# Patient Record
Sex: Female | Born: 1960 | Race: White | Hispanic: No | Marital: Married | Smoking: Never smoker
Health system: Southern US, Community
[De-identification: ages and names within clinical notes are randomized; demographics above are authoritative.]

## PROBLEM LIST (undated history)

## (undated) DIAGNOSIS — F329 Major depressive disorder, single episode, unspecified: Secondary | ICD-10-CM

## (undated) DIAGNOSIS — F32A Depression, unspecified: Secondary | ICD-10-CM

## (undated) DIAGNOSIS — G43909 Migraine, unspecified, not intractable, without status migrainosus: Secondary | ICD-10-CM

## (undated) DIAGNOSIS — F419 Anxiety disorder, unspecified: Secondary | ICD-10-CM

## (undated) HISTORY — PX: TUBAL LIGATION: SHX77

---

## 2001-05-15 ENCOUNTER — Encounter: Payer: Self-pay | Admitting: Internal Medicine

## 2001-05-15 ENCOUNTER — Inpatient Hospital Stay (HOSPITAL_COMMUNITY): Admission: EM | Admit: 2001-05-15 | Discharge: 2001-05-16 | Payer: Self-pay | Admitting: Emergency Medicine

## 2001-05-17 ENCOUNTER — Encounter: Payer: Self-pay | Admitting: Emergency Medicine

## 2001-05-18 ENCOUNTER — Encounter: Payer: Self-pay | Admitting: General Surgery

## 2001-05-18 ENCOUNTER — Inpatient Hospital Stay (HOSPITAL_COMMUNITY): Admission: EM | Admit: 2001-05-18 | Discharge: 2001-05-19 | Payer: Self-pay | Admitting: Emergency Medicine

## 2001-05-19 ENCOUNTER — Emergency Department (HOSPITAL_COMMUNITY): Admission: EM | Admit: 2001-05-19 | Discharge: 2001-05-19 | Payer: Self-pay | Admitting: *Deleted

## 2001-05-21 ENCOUNTER — Encounter: Admission: RE | Admit: 2001-05-21 | Discharge: 2001-05-21 | Payer: Self-pay

## 2001-05-27 ENCOUNTER — Inpatient Hospital Stay (HOSPITAL_COMMUNITY): Admission: EM | Admit: 2001-05-27 | Discharge: 2001-06-01 | Payer: Self-pay | Admitting: Emergency Medicine

## 2001-05-27 ENCOUNTER — Encounter: Payer: Self-pay | Admitting: Gastroenterology

## 2001-05-27 ENCOUNTER — Encounter: Admission: RE | Admit: 2001-05-27 | Discharge: 2001-05-27 | Payer: Self-pay | Admitting: Gastroenterology

## 2001-05-27 ENCOUNTER — Encounter: Payer: Self-pay | Admitting: Emergency Medicine

## 2001-05-29 ENCOUNTER — Encounter: Payer: Self-pay | Admitting: Internal Medicine

## 2001-05-30 ENCOUNTER — Encounter: Payer: Self-pay | Admitting: Internal Medicine

## 2001-05-31 ENCOUNTER — Encounter: Payer: Self-pay | Admitting: Gastroenterology

## 2001-06-24 ENCOUNTER — Encounter: Admission: RE | Admit: 2001-06-24 | Discharge: 2001-06-24 | Payer: Self-pay | Admitting: Internal Medicine

## 2002-12-20 ENCOUNTER — Emergency Department (HOSPITAL_COMMUNITY): Admission: EM | Admit: 2002-12-20 | Discharge: 2002-12-20 | Payer: Self-pay | Admitting: Emergency Medicine

## 2003-01-08 ENCOUNTER — Emergency Department (HOSPITAL_COMMUNITY): Admission: EM | Admit: 2003-01-08 | Discharge: 2003-01-08 | Payer: Self-pay | Admitting: Emergency Medicine

## 2003-04-03 ENCOUNTER — Emergency Department (HOSPITAL_COMMUNITY): Admission: EM | Admit: 2003-04-03 | Discharge: 2003-04-03 | Payer: Self-pay | Admitting: Emergency Medicine

## 2006-01-13 ENCOUNTER — Emergency Department (HOSPITAL_COMMUNITY): Admission: EM | Admit: 2006-01-13 | Discharge: 2006-01-13 | Payer: Self-pay | Admitting: Emergency Medicine

## 2007-11-22 ENCOUNTER — Emergency Department (HOSPITAL_COMMUNITY): Admission: EM | Admit: 2007-11-22 | Discharge: 2007-11-22 | Payer: Self-pay | Admitting: Emergency Medicine

## 2008-06-09 ENCOUNTER — Emergency Department (HOSPITAL_COMMUNITY): Admission: EM | Admit: 2008-06-09 | Discharge: 2008-06-09 | Payer: Self-pay | Admitting: Emergency Medicine

## 2009-02-24 ENCOUNTER — Emergency Department (HOSPITAL_COMMUNITY): Admission: EM | Admit: 2009-02-24 | Discharge: 2009-02-25 | Payer: Self-pay | Admitting: Emergency Medicine

## 2010-04-17 ENCOUNTER — Emergency Department (HOSPITAL_COMMUNITY)
Admission: EM | Admit: 2010-04-17 | Discharge: 2010-04-17 | Disposition: A | Discharge status: Home / Self Care | Payer: Self-pay | Attending: Emergency Medicine | Admitting: Emergency Medicine

## 2010-04-17 DIAGNOSIS — K130 Diseases of lips: Secondary | ICD-10-CM | POA: Insufficient documentation

## 2010-04-17 DIAGNOSIS — H6690 Otitis media, unspecified, unspecified ear: Secondary | ICD-10-CM | POA: Insufficient documentation

## 2010-04-17 DIAGNOSIS — W010XXA Fall on same level from slipping, tripping and stumbling without subsequent striking against object, initial encounter: Secondary | ICD-10-CM | POA: Insufficient documentation

## 2010-04-17 DIAGNOSIS — M545 Low back pain, unspecified: Secondary | ICD-10-CM | POA: Insufficient documentation

## 2010-04-17 DIAGNOSIS — Y929 Unspecified place or not applicable: Secondary | ICD-10-CM | POA: Insufficient documentation

## 2010-04-17 DIAGNOSIS — Y93E1 Activity, personal bathing and showering: Secondary | ICD-10-CM | POA: Insufficient documentation

## 2010-04-17 DIAGNOSIS — M2550 Pain in unspecified joint: Secondary | ICD-10-CM | POA: Insufficient documentation

## 2010-04-17 DIAGNOSIS — IMO0001 Reserved for inherently not codable concepts without codable children: Secondary | ICD-10-CM | POA: Insufficient documentation
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
## 2010-07-20 ENCOUNTER — Emergency Department (HOSPITAL_COMMUNITY)
Admission: EM | Admit: 2010-07-20 | Discharge: 2010-07-21 | Disposition: A | Discharge status: Home / Self Care | Payer: Medicaid Other | Attending: Emergency Medicine | Admitting: Emergency Medicine

## 2010-07-20 DIAGNOSIS — R110 Nausea: Secondary | ICD-10-CM | POA: Insufficient documentation

## 2010-07-20 DIAGNOSIS — G43909 Migraine, unspecified, not intractable, without status migrainosus: Secondary | ICD-10-CM | POA: Insufficient documentation

## 2010-07-20 DIAGNOSIS — H53149 Visual discomfort, unspecified: Secondary | ICD-10-CM | POA: Insufficient documentation

## 2010-11-02 ENCOUNTER — Emergency Department (HOSPITAL_COMMUNITY)
Admission: EM | Admit: 2010-11-02 | Discharge: 2010-11-02 | Disposition: A | Discharge status: Home / Self Care | Payer: Medicaid Other | Attending: Emergency Medicine | Admitting: Emergency Medicine

## 2010-11-02 DIAGNOSIS — R51 Headache: Secondary | ICD-10-CM | POA: Insufficient documentation

## 2010-11-02 DIAGNOSIS — R110 Nausea: Secondary | ICD-10-CM | POA: Insufficient documentation

## 2010-11-02 DIAGNOSIS — H53149 Visual discomfort, unspecified: Secondary | ICD-10-CM | POA: Insufficient documentation

## 2011-01-13 ENCOUNTER — Encounter: Payer: Self-pay | Admitting: Emergency Medicine

## 2011-01-13 ENCOUNTER — Emergency Department (HOSPITAL_COMMUNITY)
Admission: EM | Admit: 2011-01-13 | Discharge: 2011-01-14 | Disposition: A | Discharge status: Home / Self Care | Payer: Medicaid Other | Attending: Emergency Medicine | Admitting: Emergency Medicine

## 2011-01-13 DIAGNOSIS — Z79899 Other long term (current) drug therapy: Secondary | ICD-10-CM | POA: Insufficient documentation

## 2011-01-13 DIAGNOSIS — G43909 Migraine, unspecified, not intractable, without status migrainosus: Secondary | ICD-10-CM | POA: Insufficient documentation

## 2011-01-13 DIAGNOSIS — F329 Major depressive disorder, single episode, unspecified: Secondary | ICD-10-CM

## 2011-01-13 DIAGNOSIS — H53149 Visual discomfort, unspecified: Secondary | ICD-10-CM | POA: Insufficient documentation

## 2011-01-13 DIAGNOSIS — F3289 Other specified depressive episodes: Secondary | ICD-10-CM | POA: Insufficient documentation

## 2011-01-13 DIAGNOSIS — R45850 Homicidal ideations: Secondary | ICD-10-CM

## 2011-01-13 HISTORY — DX: Migraine, unspecified, not intractable, without status migrainosus: G43.909

## 2011-01-13 HISTORY — DX: Major depressive disorder, single episode, unspecified: F32.9

## 2011-01-13 HISTORY — DX: Anxiety disorder, unspecified: F41.9

## 2011-01-13 HISTORY — DX: Depression, unspecified: F32.A

## 2011-01-13 LAB — RAPID URINE DRUG SCREEN, HOSP PERFORMED
Amphetamines: NOT DETECTED
Benzodiazepines: NOT DETECTED
Opiates: NOT DETECTED

## 2011-01-13 LAB — BASIC METABOLIC PANEL
CO2: 25 mEq/L (ref 19–32)
Chloride: 107 mEq/L (ref 96–112)
Creatinine, Ser: 0.76 mg/dL (ref 0.50–1.10)
GFR calc Af Amer: >90 mL/min (ref >90)
Potassium: 3.1 mEq/L — ABNORMAL LOW (ref 3.5–5.1)
Sodium: 144 mEq/L (ref 135–145)

## 2011-01-13 LAB — DIFFERENTIAL
Basophils Absolute: 0 10*3/uL (ref 0.0–0.1)
Basophils Relative: 0 % (ref 0–1)
Lymphocytes Relative: 30 % (ref 12–46)
Monocytes Absolute: 0.5 10*3/uL (ref 0.1–1.0)
Neutro Abs: 4.5 10*3/uL (ref 1.7–7.7)
Neutrophils Relative %: 62 % (ref 43–77)

## 2011-01-13 LAB — CBC
HCT: 44.3 % (ref 36.0–46.0)
MCHC: 34.5 g/dL (ref 30.0–36.0)
Platelets: 306 10*3/uL (ref 150–400)
RDW: 13.9 % (ref 11.5–15.5)
WBC: 7.3 10*3/uL (ref 4.0–10.5)

## 2011-01-13 LAB — URINALYSIS, ROUTINE W REFLEX MICROSCOPIC
Glucose, UA: NEGATIVE mg/dL
Ketones, ur: NEGATIVE mg/dL
Leukocytes, UA: NEGATIVE
Nitrite: NEGATIVE
Specific Gravity, Urine: 1.002 — ABNORMAL LOW (ref 1.005–1.030)
pH: 7 (ref 5.0–8.0)

## 2011-01-13 LAB — ETHANOL: Alcohol, Ethyl (B): <11 mg/dL (ref 0–11)

## 2011-01-13 MED ORDER — ONDANSETRON HCL 8 MG PO TABS: 4.0000 mg | ORAL_TABLET | Freq: Three times a day (TID) | ORAL | Status: DC | PRN

## 2011-01-13 MED ORDER — METOCLOPRAMIDE HCL 5 MG/ML IJ SOLN
10.0000 mg | Freq: Once | INTRAMUSCULAR | Status: DC
  Filled 2011-01-13 (×2): qty 2

## 2011-01-13 MED ORDER — ACYCLOVIR 400 MG PO TABS
400.0000 mg | ORAL_TABLET | Freq: Two times a day (BID) | ORAL | Status: DC
  Filled 2011-01-13: qty 1

## 2011-01-13 MED ORDER — DEXAMETHASONE SODIUM PHOSPHATE 10 MG/ML IJ SOLN
10.0000 mg | Freq: Once | INTRAMUSCULAR | Status: AC
  Administered 2011-01-13: 10 mg via INTRAVENOUS
  Filled 2011-01-13: qty 1

## 2011-01-13 MED ORDER — IBUPROFEN 200 MG PO TABS
600.0000 mg | ORAL_TABLET | Freq: Three times a day (TID) | ORAL | Status: DC | PRN
  Administered 2011-01-14: 600 mg via ORAL
  Filled 2011-01-13: qty 3

## 2011-01-13 MED ORDER — MEDROXYPROGESTERONE ACETATE 2.5 MG PO TABS
2.5000 mg | ORAL_TABLET | Freq: Every day | ORAL | Status: DC
  Administered 2011-01-14 (×2): 2.5 mg via ORAL
  Filled 2011-01-13 (×3): qty 1

## 2011-01-13 MED ORDER — HYDROMORPHONE HCL PF 1 MG/ML IJ SOLN
1.0000 mg | Freq: Once | INTRAMUSCULAR | Status: AC
  Administered 2011-01-13: 1 mg via INTRAVENOUS
  Filled 2011-01-13 (×2): qty 1

## 2011-01-13 MED ORDER — ACETAMINOPHEN 325 MG PO TABS
650.0000 mg | ORAL_TABLET | ORAL | Status: DC | PRN
  Administered 2011-01-14: 650 mg via ORAL
  Filled 2011-01-13: qty 2

## 2011-01-13 MED ORDER — ACYCLOVIR 200 MG PO CAPS
400.0000 mg | ORAL_CAPSULE | Freq: Two times a day (BID) | ORAL | Status: DC
  Administered 2011-01-14: 400 mg via ORAL
  Filled 2011-01-13 (×2): qty 2

## 2011-01-13 MED ORDER — ALPRAZOLAM 0.5 MG PO TABS
0.5000 mg | ORAL_TABLET | Freq: Every evening | ORAL | Status: DC | PRN
  Administered 2011-01-13: 0.5 mg via ORAL
  Filled 2011-01-13 (×2): qty 1

## 2011-01-13 MED ORDER — NICOTINE 21 MG/24HR TD PT24
21.0000 mg | MEDICATED_PATCH | Freq: Every day | TRANSDERMAL | Status: DC
  Filled 2011-01-13: qty 1

## 2011-01-13 MED ORDER — DIPHENHYDRAMINE HCL 50 MG/ML IJ SOLN
25.0000 mg | Freq: Once | INTRAMUSCULAR | Status: AC
  Administered 2011-01-13: 25 mg via INTRAVENOUS
  Filled 2011-01-13: qty 1

## 2011-01-13 MED ORDER — LORAZEPAM 1 MG PO TABS
1.0000 mg | ORAL_TABLET | Freq: Three times a day (TID) | ORAL | Status: DC | PRN
  Administered 2011-01-14: 1 mg via ORAL
  Filled 2011-01-13: qty 1

## 2011-01-13 MED ORDER — HYDROMORPHONE HCL PF 1 MG/ML IJ SOLN
1.0000 mg | Freq: Once | INTRAMUSCULAR | Status: AC
  Administered 2011-01-13: 1 mg via INTRAVENOUS
  Filled 2011-01-13: qty 1

## 2011-01-13 MED ORDER — ZOLPIDEM TARTRATE 5 MG PO TABS
5.0000 mg | ORAL_TABLET | Freq: Every evening | ORAL | Status: DC | PRN
  Administered 2011-01-13: 5 mg via ORAL
  Filled 2011-01-13 (×3): qty 1
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
## 2011-01-13 NOTE — ED Notes (Signed)
Per RN Nilda Calamity, pt reports that she was upstairs visiting a pt. Pt asked same nurse when would she be seen. Nurse told pt where she was in line and pt went to bathroom. Pt was found on bathroom floor and assisted to a seated position.

## 2011-01-13 NOTE — ED Notes (Signed)
Pt reports recent stress in her life, now with migraine x 3 days.
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
## 2011-01-14 MED ORDER — METOCLOPRAMIDE HCL 5 MG/ML IJ SOLN
10.0000 mg | Freq: Once | INTRAMUSCULAR | Status: AC
  Administered 2011-01-14: 10 mg via INTRAVENOUS

## 2011-01-14 MED ORDER — HYDROMORPHONE HCL PF 2 MG/ML IJ SOLN
2.0000 mg | Freq: Once | INTRAMUSCULAR | Status: AC
  Administered 2011-01-14: 2 mg via INTRAMUSCULAR
  Filled 2011-01-14: qty 1

## 2011-01-14 MED ORDER — ONDANSETRON HCL 4 MG/2ML IJ SOLN
4.0000 mg | Freq: Once | INTRAMUSCULAR | Status: AC
  Administered 2011-01-14: 4 mg via INTRAMUSCULAR
  Filled 2011-01-14: qty 2

## 2011-01-14 MED ORDER — DEXAMETHASONE SODIUM PHOSPHATE 10 MG/ML IJ SOLN
10.0000 mg | Freq: Once | INTRAMUSCULAR | Status: AC
  Administered 2011-01-14: 10 mg via INTRAVENOUS
  Filled 2011-01-14: qty 1

## 2011-01-14 MED ORDER — DIPHENHYDRAMINE HCL 50 MG/ML IJ SOLN
25.0000 mg | Freq: Once | INTRAMUSCULAR | Status: AC
  Administered 2011-01-14: 25 mg via INTRAVENOUS
  Filled 2011-01-14: qty 1

## 2011-01-14 MED ORDER — OXYCODONE-ACETAMINOPHEN 5-325 MG PO TABS: 2.0000 | ORAL_TABLET | ORAL | Status: AC | PRN

## 2011-01-14 NOTE — ED Notes (Signed)
Pt watching TV, NAD, respirations equal and nonlabored .  Pt continues to rate headache at 7/10, no N/V.  Pt has requested additional pain medication.  No current orders at this time. Will continue to monitor. VSS, A/O x3, denies CP/SOB, PEARL

## 2011-01-14 NOTE — ED Notes (Signed)
Pt finished tele-psych. Pt exhibiting anger, aggression regarding family dynamics. Pt irritable, agitated. Still c/o headache despite medication. Psychiatrists calling Dr. Ignacia Palma with recommendation.

## 2011-01-14 NOTE — ED Provider Notes (Signed)
Asked to see pt because she has a bad migraine headache.  She is unable to characterize her headache, other than to say that she has had headache intermittently for many years, and has received a number of different medications, including morphine and Dilaudid, over the years.  Exam today shows her to be awake and alert.  HEENT negative.  Neck supple.  Neur physiological.  Will order migraine cocktail of Reglan, dexamethasone, and Benadryl.  Also will order telepsych consult, as pt has not been accepted for admission for depression as yet.  Carleene Cooper III, MD 01/14/11 [phone]

## 2011-01-14 NOTE — ED Notes (Signed)
Medicated per order.  Pt VSS, pain rated at 7/10.  She reports that she has had no relief with Ibuprofen and requests "something else".

## 2011-01-14 NOTE — ED Notes (Signed)
Pt c/o headache despite medication. Dr. Ignacia Palma notified. EDP getting in touch with Behavioral health to determine if pt discharged home or admitted.

## 2011-01-14 NOTE — ED Notes (Signed)
Pt again requesting  IV pain medication.  Nurse practitioner notified of pt request.  New order received.

## 2011-01-14 NOTE — ED Notes (Signed)
Discharged home with husband. ACT team and Dr. Ignacia Palma spoke with patient, given outpatient referral. Pt deemed safe to be discharged home by Dr. Ignacia Palma, Tele-psych psychiatrist, ACT team. Pt calm on discharge. Alert and oriented.

## 2011-01-14 NOTE — ED Notes (Signed)
Pt notified, EDP working on patient case. Calling Behavioral Health

## 2011-01-14 NOTE — ED Notes (Signed)
Patient is currently talking with psychiatrist via tele psych.  Maralyn Sago, RN at bedside.

## 2011-01-14 NOTE — ED Notes (Signed)
Pt medicated. Informed of discharge home. Pt husband at bedside. Pt calm, acting appriopriatey.
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
## 2011-01-14 NOTE — ED Notes (Signed)
Pt c/o headache 9/10. Pt medicated with tylenol per Admission Orders.

## 2011-01-14 NOTE — ED Notes (Addendum)
Pt c/o headache. Pt appearing restless, becoming agitated. Pt given 1 mg Ativan per admission orders
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
## 2011-01-14 NOTE — ED Notes (Signed)
Pt in scrubs and wanded by security. Belongings inventoried

## 2011-01-14 NOTE — Progress Notes (Signed)
Pt had telepsych consult and [facility] evaluation, and the decision was that she is safe to go home.  I advised pt's husband that it would be best is his son lived somewhere else.  The husband says his son cannot afford to live elsewhere, and there is a restraining order against the son already.  Released.  Rx Percocet if needed for her headaches, as migraine meds were ineffective.

## 2011-01-14 NOTE — ED Notes (Signed)
Pt c/o migraine headache rated at 8/10.  She reports that the last pain medication given "did help some". She denies N/V at this time and reports that she has recently been under a great deal of stress.  After speaking with the nurse practitioner, a new order for pain medication was given.

## 2011-01-14 NOTE — ED Notes (Signed)
Pt requesting to have IV medications given IM, Pt IV was bent and had to be removed. Second attempt to start IV, unsuccessfully. Medications given IM in hips

## 2011-01-14 NOTE — ED Notes (Signed)
Counselor with [facility] at bedside for evaluation of patient to determine disposition.  Pt admits to homicidal ideation and was informed by the representative from [facility] that if she did not voluntarily commit to treatment, a mental hiegine order would be filed against her that would result in involuntary admission.  Pt expresses verbal understanding.

## 2011-01-14 NOTE — ED Notes (Signed)
Pt appears somewhat anxious and restless. Awaiting  Possible admissionevaluation from [facility]

## 2011-01-14 NOTE — ED Notes (Signed)
Pt report given and care endorsed to Zara Council, RN

## 2011-04-06 ENCOUNTER — Observation Stay: Payer: Self-pay | Admitting: Specialist

## 2011-04-06 LAB — BASIC METABOLIC PANEL
BUN: 8 mg/dL (ref 7–18)
Creatinine: 0.73 mg/dL (ref 0.60–1.30)
EGFR (Non-African Amer.): >60
Glucose: 92 mg/dL (ref 65–99)
Osmolality: 289 (ref 275–301)
Potassium: 3.2 mmol/L — ABNORMAL LOW (ref 3.5–5.1)
Sodium: 146 mmol/L — ABNORMAL HIGH (ref 136–145)

## 2011-04-06 LAB — CK TOTAL AND CKMB (NOT AT ARMC)
CK, Total: 145 U/L (ref 21–215)
CK, Total: 104 U/L (ref 21–215)
CK-MB: 2.7 ng/mL (ref 0.5–3.6)
CK-MB: 2.2 ng/mL (ref 0.5–3.6)

## 2011-04-06 LAB — CBC
MCV: 89 fL (ref 80–100)
Platelet: 271 10*3/uL (ref 150–440)
RBC: 4.38 10*6/uL (ref 3.80–5.20)
RDW: 14.8 % — ABNORMAL HIGH (ref 11.5–14.5)
WBC: 6.9 10*3/uL (ref 3.6–11.0)

## 2011-12-02 ENCOUNTER — Emergency Department: Payer: Self-pay | Admitting: Emergency Medicine

## 2012-01-10 ENCOUNTER — Encounter (HOSPITAL_COMMUNITY): Payer: Self-pay

## 2012-01-10 ENCOUNTER — Inpatient Hospital Stay (HOSPITAL_COMMUNITY)
Admission: AD | Admit: 2012-01-10 | Discharge: 2012-01-14 | DRG: 885 | Disposition: A | Discharge status: Home / Self Care | Payer: Medicaid Other | Source: Ambulatory Visit | Attending: Psychiatry | Admitting: Psychiatry

## 2012-01-10 DIAGNOSIS — IMO0002 Reserved for concepts with insufficient information to code with codable children: Secondary | ICD-10-CM

## 2012-01-10 DIAGNOSIS — F3160 Bipolar disorder, current episode mixed, unspecified: Principal | ICD-10-CM | POA: Diagnosis present

## 2012-01-10 DIAGNOSIS — Z79899 Other long term (current) drug therapy: Secondary | ICD-10-CM

## 2012-01-10 DIAGNOSIS — N3281 Overactive bladder: Secondary | ICD-10-CM

## 2012-01-10 DIAGNOSIS — F4310 Post-traumatic stress disorder, unspecified: Secondary | ICD-10-CM | POA: Diagnosis present

## 2012-01-10 DIAGNOSIS — R059 Cough, unspecified: Secondary | ICD-10-CM | POA: Diagnosis present

## 2012-01-10 DIAGNOSIS — R05 Cough: Secondary | ICD-10-CM | POA: Diagnosis present

## 2012-01-10 DIAGNOSIS — G43909 Migraine, unspecified, not intractable, without status migrainosus: Secondary | ICD-10-CM | POA: Diagnosis present

## 2012-01-10 MED ORDER — ACETAMINOPHEN 325 MG PO TABS
650.0000 mg | ORAL_TABLET | Freq: Four times a day (QID) | ORAL | Status: DC | PRN
  Administered 2012-01-10: 650 mg via ORAL

## 2012-01-10 MED ORDER — ALUM & MAG HYDROXIDE-SIMETH 200-200-20 MG/5ML PO SUSP: 30.0000 mL | ORAL | Status: DC | PRN

## 2012-01-10 MED ORDER — HYDROXYZINE HCL 25 MG PO TABS
25.0000 mg | ORAL_TABLET | Freq: Four times a day (QID) | ORAL | Status: DC | PRN
  Administered 2012-01-10 (×2): 25 mg via ORAL

## 2012-01-10 MED ORDER — TRAZODONE HCL 100 MG PO TABS
100.0000 mg | ORAL_TABLET | Freq: Every day | ORAL | Status: DC
Start: 2012-01-10 — End: 2012-01-10
  Administered 2012-01-10: 100 mg via ORAL
  Filled 2012-01-10 (×3): qty 1

## 2012-01-10 MED ORDER — MAGNESIUM HYDROXIDE 400 MG/5ML PO SUSP: 30.0000 mL | Freq: Every day | ORAL | Status: DC | PRN

## 2012-01-10 MED ORDER — TRAZODONE HCL 100 MG PO TABS
100.0000 mg | ORAL_TABLET | Freq: Every evening | ORAL | Status: DC | PRN
  Administered 2012-01-10 – 2012-01-13 (×6): 100 mg via ORAL
  Filled 2012-01-10 (×5): qty 1
  Filled 2012-01-10: qty 6
  Filled 2012-01-10 (×5): qty 1
  Filled 2012-01-10: qty 6
  Filled 2012-01-10: qty 1
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
## 2012-01-11 DIAGNOSIS — F3160 Bipolar disorder, current episode mixed, unspecified: Secondary | ICD-10-CM | POA: Diagnosis present

## 2012-01-11 DIAGNOSIS — F4310 Post-traumatic stress disorder, unspecified: Secondary | ICD-10-CM | POA: Diagnosis present

## 2012-01-11 LAB — URINALYSIS, ROUTINE W REFLEX MICROSCOPIC
Glucose, UA: NEGATIVE mg/dL
Ketones, ur: NEGATIVE mg/dL
Leukocytes, UA: NEGATIVE
Nitrite: NEGATIVE
Protein, ur: NEGATIVE mg/dL
Urobilinogen, UA: 0.2 mg/dL (ref 0.0–1.0)

## 2012-01-11 LAB — RAPID URINE DRUG SCREEN, HOSP PERFORMED
Amphetamines: NOT DETECTED
Benzodiazepines: NOT DETECTED
Opiates: NOT DETECTED

## 2012-01-11 LAB — PREGNANCY, URINE: Preg Test, Ur: NEGATIVE

## 2012-01-11 MED ORDER — RISPERIDONE 1 MG PO TABS
1.0000 mg | ORAL_TABLET | Freq: Every day | ORAL | Status: DC
  Administered 2012-01-11 – 2012-01-13 (×3): 1 mg via ORAL
  Filled 2012-01-11 (×3): qty 1
  Filled 2012-01-11: qty 3
  Filled 2012-01-11 (×2): qty 1

## 2012-01-11 MED ORDER — GABAPENTIN 100 MG PO CAPS
200.0000 mg | ORAL_CAPSULE | Freq: Three times a day (TID) | ORAL | Status: DC
  Administered 2012-01-11 – 2012-01-14 (×10): 200 mg via ORAL
  Filled 2012-01-11 (×7): qty 2
  Filled 2012-01-11: qty 18
  Filled 2012-01-11 (×8): qty 2
  Filled 2012-01-11: qty 18
  Filled 2012-01-11: qty 2
  Filled 2012-01-11: qty 18

## 2012-01-11 MED ORDER — IBUPROFEN 600 MG PO TABS
600.0000 mg | ORAL_TABLET | ORAL | Status: DC | PRN
  Administered 2012-01-11 – 2012-01-12 (×3): 600 mg via ORAL
  Filled 2012-01-11 (×3): qty 1

## 2012-01-11 MED ORDER — DIVALPROEX SODIUM ER 500 MG PO TB24
500.0000 mg | ORAL_TABLET | Freq: Two times a day (BID) | ORAL | Status: DC
  Administered 2012-01-11 – 2012-01-14 (×7): 500 mg via ORAL
  Filled 2012-01-11 (×4): qty 1
  Filled 2012-01-11: qty 6
  Filled 2012-01-11 (×2): qty 1
  Filled 2012-01-11: qty 6
  Filled 2012-01-11 (×5): qty 1
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
## 2012-01-11 NOTE — Progress Notes (Signed)
D: Pt complains of insomnia, sadness, and hopelessness due to family issues.  Interaction is direct but with avoidant eye contact and anxious expression.  Affect is anxious; mood anxious, depressed, sad.  Cooperative and compliant with unit guidelines and expectations, although did not attend evening shift groups "I'll just go and sit in my little hole".  Denies SI/HI, AVH.  Contracting for safety.  Reported headache rated at 6/10 at 2045 but refused intervention.  No evidence of disorganized thought process or content.  Pt is very angry that she is unable to sleep, explaining that it exacerbates her sense of hopelessness and apathy.  A: Pt given support in 1:1 regarding family issues; discussed coping skills.  Encouraged Pt to take advantage of peers and groups on milieu.  PRNs: Repeat Trazodone 100mg  PO @2345 , Vistaril 25mg  PO @2304  with no effect from either medication.  All medications administered according to med orders and initial POC.  Pt placed on Q15 minute safety checks as per unit protocol.  R: Pt very depressed and hopeless, made worse by inability to sleep.  Expresses deep frustration with medical care.  Safety maintained. Dion Saucier RN
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
## 2012-01-11 NOTE — Progress Notes (Signed)
[facility] Group Notes:  (Counselor/Nursing/MHT/Case Management/Adjunct)  01/11/2012 11:11 PM  Type of Therapy:  Psychoeducational Skills  Participation Level:  Minimal  Participation Quality:  Attentive  Affect:  Depressed  Cognitive:  Appropriate  Insight:  Limited  Engagement in Group:  Limited  Engagement in Therapy:  Limited  Modes of Intervention:  Education  Summary of Progress/Problems: The patient described her day as being "okay". She states that she does not have any homicidal thoughts. In addition, she mentioned that she was surprised when her husband and daughter came in to visit her. She doesn't feel that her family is supportive of her and that they are trying to find out where she is .   Her goal for tomorrow is to work on her "negativity" and "hate".    Colleen Delacruz 01/11/2012, 11:11 PM

## 2012-01-11 NOTE — Progress Notes (Signed)
D: Patient appropriate and cooperative with staff and peers. Mood/affect anxious. She reported on self inventory sheet that her energy level is low, but ability to pay attention is good. Patient rated depression and feelings of hopelessness "7".  A: Support and encouragement provided to patient. Monitor 15 minute checks for safety.  R: Patient receptive. Denies SI/HI/AVH. Patient remains safe.
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
## 2012-01-12 LAB — CBC WITH DIFFERENTIAL/PLATELET
Basophils Absolute: 0 10*3/uL (ref 0.0–0.1)
Eosinophils Absolute: 0.4 10*3/uL (ref 0.0–0.7)
Eosinophils Relative: 6 % — ABNORMAL HIGH (ref 0–5)
MCH: 29.4 pg (ref 26.0–34.0)
MCHC: 34.2 g/dL (ref 30.0–36.0)
MCV: 85.9 fL (ref 78.0–100.0)
Platelets: 240 10*3/uL (ref 150–400)
RDW: 13.5 % (ref 11.5–15.5)

## 2012-01-12 LAB — COMPREHENSIVE METABOLIC PANEL
ALT: 23 U/L (ref 0–35)
AST: 26 U/L (ref 0–37)
Calcium: 9.3 mg/dL (ref 8.4–10.5)
GFR calc Af Amer: >90 mL/min (ref >90)
Glucose, Bld: 106 mg/dL — ABNORMAL HIGH (ref 70–99)
Sodium: 140 mEq/L (ref 135–145)
Total Protein: 7 g/dL (ref 6.0–8.3)

## 2012-01-12 LAB — T3, FREE: T3, Free: 3.6 pg/mL (ref 2.3–4.2)

## 2012-01-12 LAB — T4, FREE: Free T4: 1.25 ng/dL (ref 0.80–1.80)

## 2012-01-12 NOTE — Progress Notes (Signed)
[facility] Group Notes:  (Counselor/Nursing/MHT/Case Management/Adjunct)  01/12/2012 10:28 PM  Type of Therapy:  Psychoeducational Skills  Participation Level:  Active  Participation Quality:  Attentive  Affect:  Blunted  Cognitive:  Appropriate  Insight:  Good  Engagement in Group:  Good  Engagement in Therapy:  Good  Modes of Intervention:  Education  Summary of Progress/Problems:The patient mentioned in group this evening that she had a good day today. She attributed her positive results today to spending more time in the dayroom and to having a good visit with her family. Her goal for tomorrow is to "beat my anger".    Colleen Delacruz S 01/12/2012, 10:28 PM

## 2012-01-12 NOTE — Progress Notes (Signed)
D: Pt mood is depressed. Pt maintains fair eye contact. Pt rates her depression 1/10 and hopelessness 1/10. Pt attended group tonight. Pt interacts well within milieu.  A: Support given. Verbalization encouraged. Pt encouraged to come to nurse with any concerns. Medications given as ordered.   R: Pt is receptive, cooperative. No complaints of pain or discomfort at this time. Q15 min safety checks maintained. Will continue to monitor.

## 2012-01-12 NOTE — Progress Notes (Signed)
[facility] MD Progress Note  01/12/2012 11:37 AM Colleen Delacruz  MRN:  [id number]   Subjective: Met with the patient 1:1 to discuss her response to treatment.  She immediately asks to be discharged. States she has to go to work. She is explained that the current plan of care does not indicate that she is on the discharge list for the weekend. Diagnosis:   AXIS I: Bipolar affective disorder, mixed, PTSD  AXIS II: Deferred  AXIS III:  Past Medical History   Diagnosis  Date   .  Migraines    .  Depression    .  Anxiety     AXIS IV: other psychosocial or environmental problems  AXIS V: 11-20 some danger of hurting self or others possible OR occasionally fails to maintain minimal personal hygiene OR gross impairment in communication   ADL's:  Intact  Sleep: Fair  Appetite:  Fair  Suicidal Ideation:  denies Homicidal Ideation:  denies  AEB (as evidenced by):  Mental Status Examination/Evaluation: Objective:  Appearance: Disheveled  Eye Contact::  Fair  Speech:  Pressured  Volume:  Increased  Mood:  Anxious and Depressed  Affect:  Congruent  Thought Process:  Circumstantial  Orientation:  Full  Thought Content:  WDL  Suicidal Thoughts:  No  Homicidal Thoughts:  No  Memory:  Immediate;   Fair  Judgement:  Impaired  Insight:  Lacking  Psychomotor Activity:  Normal  Concentration:  Fair  Recall:  Fair  Akathisia:  No  Handed:  Right  AIMS (if indicated):     Assets:  Desire for Improvement Housing  Sleep:  Number of Hours: 6.5    Vital Signs:Blood pressure 122/78, pulse 80, temperature 97.4 F (36.3 C), temperature source Oral, resp. rate 16. Current Medications: Current Facility-Administered Medications  Medication Dose Route Frequency Provider Last Rate Last Dose  . acetaminophen (TYLENOL) tablet 650 mg  650 mg Oral Q6H PRN Nanine Means, NP   650 mg at 01/10/12 1814  . alum & mag hydroxide-simeth (MAALOX/MYLANTA) 200-200-20 MG/5ML suspension 30 mL  30 mL Oral Q4H  PRN Nanine Means, NP      . divalproex (DEPAKOTE ER) 24 hr tablet 500 mg  500 mg Oral BID Sanjuana Kava, NP   500 mg at 01/12/12 0800  . gabapentin (NEURONTIN) capsule 200 mg  200 mg Oral TID Sanjuana Kava, NP   200 mg at 01/12/12 0800  . hydrOXYzine (ATARAX/VISTARIL) tablet 25 mg  25 mg Oral Q6H PRN Nanine Means, NP   25 mg at 01/10/12 2304  . ibuprofen (ADVIL,MOTRIN) tablet 600 mg  600 mg Oral Q4H PRN Sanjuana Kava, NP   600 mg at 01/12/12 0654  . magnesium hydroxide (MILK OF MAGNESIA) suspension 30 mL  30 mL Oral Daily PRN Nanine Means, NP      . risperiDONE (RISPERDAL) tablet 1 mg  1 mg Oral QHS Sanjuana Kava, NP   1 mg at 01/11/12 2111  . traZODone (DESYREL) tablet 100 mg  100 mg Oral QHS,MR X 1 Himabindu Ravi, MD   100 mg at 01/11/12 2111    Lab Results:  Results for orders placed during the hospital encounter of 01/10/12 (from the past 48 hour(s))  URINALYSIS, ROUTINE W REFLEX MICROSCOPIC     Status: Normal   Collection Time   01/11/12 11:52 AM      Component Value Range Comment   Color, Urine YELLOW  YELLOW    APPearance CLEAR  CLEAR  Specific Gravity, Urine 1.005  1.005 - 1.030    pH 6.5  5.0 - 8.0    Glucose, UA NEGATIVE  NEGATIVE mg/dL    Hgb urine dipstick NEGATIVE  NEGATIVE    Bilirubin Urine NEGATIVE  NEGATIVE    Ketones, ur NEGATIVE  NEGATIVE mg/dL    Protein, ur NEGATIVE  NEGATIVE mg/dL    Urobilinogen, UA 0.2  0.0 - 1.0 mg/dL    Nitrite NEGATIVE  NEGATIVE    Leukocytes, UA NEGATIVE  NEGATIVE MICROSCOPIC NOT DONE ON URINES WITH NEGATIVE PROTEIN, BLOOD, LEUKOCYTES, NITRITE, OR GLUCOSE <1000 mg/dL.  PREGNANCY, URINE     Status: Normal   Collection Time   01/11/12 11:52 AM      Component Value Range Comment   Preg Test, Ur NEGATIVE  NEGATIVE   URINE RAPID DRUG SCREEN (HOSP PERFORMED)     Status: Abnormal   Collection Time   01/11/12 11:52 AM      Component Value Range Comment   Opiates NONE DETECTED  NONE DETECTED    Cocaine NONE DETECTED  NONE DETECTED     Benzodiazepines NONE DETECTED  NONE DETECTED    Amphetamines NONE DETECTED  NONE DETECTED    Tetrahydrocannabinol NONE DETECTED  NONE DETECTED    Barbiturates POSITIVE (*) NONE DETECTED   CBC WITH DIFFERENTIAL     Status: Abnormal   Collection Time   01/12/12  7:04 AM      Component Value Range Comment   WBC 6.7  4.0 - 10.5 K/uL    RBC 4.97  3.87 - 5.11 MIL/uL    Hemoglobin 14.6  12.0 - 15.0 g/dL    HCT 40.9  81.1 - 91.4 %    MCV 85.9  78.0 - 100.0 fL    MCH 29.4  26.0 - 34.0 pg    MCHC 34.2  30.0 - 36.0 g/dL    RDW 78.2  95.6 - 21.3 %    Platelets 240  150 - 400 K/uL    Neutrophils Relative 59  43 - 77 %    Neutro Abs 3.9  1.7 - 7.7 K/uL    Lymphocytes Relative 28  12 - 46 %    Lymphs Abs 1.9  0.7 - 4.0 K/uL    Monocytes Relative 6  3 - 12 %    Monocytes Absolute 0.4  0.1 - 1.0 K/uL    Eosinophils Relative 6 (*) 0 - 5 %    Eosinophils Absolute 0.4  0.0 - 0.7 K/uL    Basophils Relative 1  0 - 1 %    Basophils Absolute 0.0  0.0 - 0.1 K/uL   COMPREHENSIVE METABOLIC PANEL     Status: Abnormal   Collection Time   01/12/12  7:04 AM      Component Value Range Comment   Sodium 140  135 - 145 mEq/L    Potassium 4.2  3.5 - 5.1 mEq/L    Chloride 104  96 - 112 mEq/L    CO2 22  19 - 32 mEq/L    Glucose, Bld 106 (*) 70 - 99 mg/dL    BUN 13  6 - 23 mg/dL    Creatinine, Ser 0.86  0.50 - 1.10 mg/dL    Calcium 9.3  8.4 - 57.8 mg/dL    Total Protein 7.0  6.0 - 8.3 g/dL    Albumin 3.5  3.5 - 5.2 g/dL    AST 26  0 - 37 U/L    ALT 23  0 -  35 U/L    Alkaline Phosphatase 89  39 - 117 U/L    Total Bilirubin 0.2 (*) 0.3 - 1.2 mg/dL    GFR calc non Af Amer >90  >90 mL/min    GFR calc Af Amer >90  >90 mL/min     Physical Findings: AIMS: Facial and Oral Movements Muscles of Facial Expression: None, normal Lips and Perioral Area: None, normal Jaw: None, normal Tongue: None, normal,Extremity Movements Upper (arms, wrists, hands, fingers): None, normal Lower (legs, knees, ankles, toes):  None, normal, Trunk Movements Neck, shoulders, hips: None, normal, Overall Severity Severity of abnormal movements (highest score from questions above): None, normal Incapacitation due to abnormal movements: None, normal Patient's awareness of abnormal movements (rate only patient's report): No Awareness, Dental Status Current problems with teeth and/or dentures?: No Does patient usually wear dentures?: No  CIWA:  CIWA-Ar Total: 0  COWS:  COWS Total Score: 0   Treatment Plan Summary: Daily contact with patient to assess and evaluate symptoms and progress in treatment Medication management  Plan: 1. Will continue the current plan of care with no changes at this time. Rona Ravens. Ger Nicks PAC 01/12/2012, 11:37 AM
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
## 2012-01-12 NOTE — Progress Notes (Signed)
Psychoeducational Group Note  Date:  01/12/2012 Time:  1515  Group Topic/Focus:  Healthy Communication:   The focus of this group is to discuss communication, barriers to communication, as well as healthy ways to communicate with others.  Participation Level:  Active  Participation Quality:  Appropriate, Redirectable, Sharing and Supportive  Affect:  Appropriate and Excited  Cognitive:  Appropriate  Insight:  Good  Engagement in Group:  Good  Additional Comments:  none  Izaha Shughart M 01/12/2012, 2:06 PM

## 2012-01-12 NOTE — Progress Notes (Signed)
Group Topic/Focus:  Identifying Needs:   The focus of this group is to help patients identify their personal needs that have been historically problematic and identify healthy behaviors to address their needs.  Participation Level:  Active  Participation Quality:  Appropriate  Affect:  Appropriate  Cognitive:  Appropriate  Insight:  Good  Engagement in Group:  Good  Additional Comments:    Savannah Erbe A  

## 2012-01-13 DIAGNOSIS — N3281 Overactive bladder: Secondary | ICD-10-CM | POA: Diagnosis present

## 2012-01-13 MED ORDER — BENZONATATE 100 MG PO CAPS
100.0000 mg | ORAL_CAPSULE | Freq: Two times a day (BID) | ORAL | Status: DC
  Administered 2012-01-13 – 2012-01-14 (×2): 100 mg via ORAL
  Filled 2012-01-13 (×6): qty 1

## 2012-01-13 MED ORDER — PSEUDOEPHEDRINE HCL ER 120 MG PO TB12
120.0000 mg | ORAL_TABLET | Freq: Two times a day (BID) | ORAL | Status: DC
  Administered 2012-01-13 – 2012-01-14 (×2): 120 mg via ORAL
  Filled 2012-01-13 (×6): qty 1

## 2012-01-13 MED ORDER — SALINE SPRAY 0.65 % NA SOLN
1.0000 | NASAL | Status: DC | PRN
  Administered 2012-01-13: 1 via NASAL
  Filled 2012-01-13: qty 44

## 2012-01-13 MED ORDER — OXYBUTYNIN CHLORIDE 5 MG PO TABS
2.5000 mg | ORAL_TABLET | Freq: Two times a day (BID) | ORAL | Status: DC
  Administered 2012-01-13 – 2012-01-14 (×2): 2.5 mg via ORAL
  Filled 2012-01-13 (×6): qty 0.5
  Filled 2012-01-13 (×2): qty 3
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
## 2012-01-13 NOTE — Progress Notes (Signed)
Psychoeducational Group Note  Date:  01/13/2012 Time:  1015  Group Topic/Focus:  Making Healthy Choices:   The focus of this group is to help patients identify negative/unhealthy choices they were using prior to admission and identify positive/healthier coping strategies to replace them upon discharge.  Participation Level:  Active  Participation Quality:  Appropriate  Affect:  Appropriate  Cognitive:  Appropriate  Insight:  Good  Engagement in Group:  Good  Additional Comments:    Dione Housekeeper 01/13/2012

## 2012-01-13 NOTE — Progress Notes (Signed)
Psychoeducational Group Note  Date:  01/13/2012 Time:  1315  Group Topic/Focus:  Conflict Resolution:   The focus of this group is to discuss the conflict resolution process and how it may be used upon discharge.  Participation Level:  Active  Participation Quality:  Appropriate, Sharing and Supportive  Affect:  Appropriate  Cognitive:  Appropriate  Insight:  Good  Engagement in Group:  Good  Additional Comments:  none  Ahni Bradwell M 01/13/2012, 2:20 PM
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
## 2012-01-13 NOTE — Clinical Social Work Note (Signed)
[facility] Group Notes:  (Clinical Social Work)  01/13/2012   3:00-4:00PM  Summary of Progress/Problems:   The main focus of today's process group was for the patient to define "support" and describe what healthy supports are, then to identify the patient's current support system and decide on other supports that can be put in place to prevent future hospitalizations.  An emphasis was placed on using therapist, doctor and problem-specific support groups to expand supports. The patient expressed that her daughter (almost 106) is supportive, and maybe her husband will be.  She also has a brother she considers to be supportive, and her co-workers at her job also.  She wants to go to a psychiatrist/therapist, and will use them as supports.  She was confused about the difference between a psychiatrist and psychologist, so CSW explained this.  Type of Therapy:  Process Group  Participation Level:  Active  Participation Quality:  Attentive, Resistant and Sharing  Affect:  Blunted and Irritable  Cognitive:  Alert and Oriented  Insight:  Limited  Engagement in Group:  Good  Engagement in Therapy:  Good  Modes of Intervention:  Clarification, Education, Limit-setting, Problem-solving, Socialization, Support and Processing   Ambrose Mantle, LCSW 01/13/2012,

## 2012-01-14 LAB — VALPROIC ACID LEVEL: Valproic Acid Lvl: 55.4 ug/mL (ref 50.0–100.0)

## 2012-01-14 MED ORDER — TRAZODONE HCL 100 MG PO TABS: 100.0000 mg | ORAL_TABLET | Freq: Every evening | ORAL | Status: DC | PRN

## 2012-01-14 MED ORDER — RISPERIDONE 1 MG PO TABS: 1.0000 mg | ORAL_TABLET | Freq: Every day | ORAL | Status: DC

## 2012-01-14 MED ORDER — DIVALPROEX SODIUM ER 500 MG PO TB24: 500.0000 mg | ORAL_TABLET | Freq: Two times a day (BID) | ORAL | Status: DC

## 2012-01-14 MED ORDER — IBUPROFEN 200 MG PO TABS: 400.0000 mg | ORAL_TABLET | Freq: Four times a day (QID) | ORAL | Status: AC | PRN

## 2012-01-14 MED ORDER — GABAPENTIN 100 MG PO CAPS: 200.0000 mg | ORAL_CAPSULE | Freq: Three times a day (TID) | ORAL | Status: DC

## 2012-01-14 MED ORDER — OXYBUTYNIN CHLORIDE 5 MG PO TABS: 2.5000 mg | ORAL_TABLET | Freq: Two times a day (BID) | ORAL | Status: DC
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
## 2012-01-14 NOTE — [facility] Suicide Risk Assessment (Signed)
Suicide Risk Assessment  Discharge Assessment     Demographic Factors:  Female, Caucasian, employed, married  Mental Status Per Nursing Assessment::   On Admission:  Thoughts of violence towards others  Current Mental Status by Physician: Patient alert and oriented to 4. Denies AH/VH/SI/HI.  Loss Factors: Loss of significant relationship  Historical Factors: Family history of mental illness or substance abuse and Impulsivity  Risk Reduction Factors:   Responsible for children under 61 years of age, Employed, Positive social support and Positive coping skills or problem solving skills  Continued Clinical Symptoms:  Depression:   Recent sense of peace/wellbeing  Cognitive Features That Contribute To Risk:  Thought constriction (tunnel vision)    Suicide Risk:  Minimal: No identifiable suicidal ideation.  Patients presenting with no risk factors but with morbid ruminations; may be classified as minimal risk based on the severity of the depressive symptoms  Discharge Diagnoses:   AXIS I:  Depressive Disorder NOS and Post Traumatic Stress Disorder AXIS II:  No diagnosis AXIS III:   Past Medical History  Diagnosis Date  . Migraines   . Depression   . Anxiety    AXIS IV:  other psychosocial or environmental problems AXIS V:  61-70 mild symptoms  Plan Of Care/Follow-up recommendations:  Activity:  normal Diet:  normal Follow up with outpatient appointments.  Is patient on multiple antipsychotic therapies at discharge:  No   Has Patient had three or more failed trials of antipsychotic monotherapy by history:  No  Recommended Plan for Multiple Antipsychotic Therapies: NA  Abshir Paolini 01/14/2012, 10:17 AM

## 2012-01-14 NOTE — Tx Team (Addendum)
Interdisciplinary Treatment Plan Update (Adult)  Date:  01/14/2012  Time Reviewed:  10:00 AM   Progress in Treatment: Attending groups:   Yes   Participating in groups:  Yes Taking medication as prescribed:  Yes Tolerating medication:  Yes Family/Significant othe contact made: Contact to be made with family Patient understands diagnosis:  Yes Discussing patient identified problems/goals with staff: Yes Medical problems stabilized or resolved: Yes Denies suicidal/homicidal ideation:Yes Issues/concerns per patient self-inventory:  Other:   New problem(s) identified:  Reason for Continuation of Hospitalization:  Interventions implemented related to continuation of hospitalization:  Additional comments:  Estimated length of stay:  Discharge home today  Discharge Plan:  Home with outpatient follow up  New goal(s):  Review of initial/current patient goals per problem list:    1.  Goal(s): Eliminate SI/other thoughts of self harm   Met:  Yes  Target date: d/c  As evidenced by: Patient no longer endorsing SI/HI or other thoughts of self harm.    2.  Goal (s):Reduce depression/anxiety (rated at one today)  Met: Yes  Target date: d/c  As evidenced by: Patient will rate symptoms at four or below    3.  Goal(s):.stabilize on meds   Met:  Yes  Target date: d/c  As evidenced by: Patient reports being stabilized on medications - less symptomatic    4.  Goal(s): Refer for outpatient follow up   Met:  No  Target date: d/c  As evidenced by: Follow up appointment will be scheduled    Attendees: Patient:  Colleen Delacruz 01/14/2012 10:00 AM  Physican:  Patrick North, MD 01/14/2012 10:00 AM  Nursing:  Neill Loft, RN 01/14/2012 10:00 AM   Nursing:   Quintella Reichert, RN 01/14/2012 10:00 AM   Clinical Social Worker:  Juline Patch, LCSW 01/14/2012 10:00 AM   Other: Serena Colonel, FNP 01/14/2012 10:00 AM
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
## 2012-01-14 NOTE — Progress Notes (Signed)
[facility] Group Notes:  (Counselor/Nursing/MHT/Case Management/Adjunct)  01/14/2012 12:13 AM  Type of Therapy:  Psychoeducational Skills  Participation Level:  Active  Participation Quality:  Appropriate  Affect:  Appropriate  Cognitive:  Appropriate  Insight:  Good  Engagement in Group:  Good  Engagement in Therapy:  Good  Modes of Intervention:  Education  Summary of Progress/Problems: The patient mentioned that she had a good day overall. She attended groups and had a family visit. The patient verbalized that she met with her husband and that the visit didn't go well. Her goal for tomorrow is to get discharged.    Ardean Melroy S 01/14/2012, 12:13 AM

## 2012-01-14 NOTE — Progress Notes (Signed)
[facility] Adult Inpatient Family/Significant Other Suicide Prevention Education  Suicide Prevention Education:  Education Completed; Markus Daft, Husband, [phone] has been identified by the patient as the family member/significant other with whom the patient will be residing, and identified as the person(s) who will aid the patient in the event of a mental health crisis (suicidal ideations/suicide attempt).  With written consent from the patient, the family member/significant other has been provided the following suicide prevention education, prior to the and/or following the discharge of the patient.  The suicide prevention education provided includes the following:  Suicide risk factors  Suicide prevention and interventions  National Suicide Hotline telephone number  [facility] assessment telephone number  [facility] Emergency Assistance 911  [facility] and/or Residential Mobile Crisis Unit telephone number  Request made of family/significant other to:  Remove weapons (e.g., guns, rifles, knives), all items previously/currently identified as safety concern.  Husband advised there are no guns in the home.  Remove drugs/medications (over-the-counter, prescriptions, illicit drugs), all items previously/currently identified as a safety concern.  The family member/significant other verbalizes understanding of the suicide prevention education information provided.  The family member/significant other agrees to remove the items of safety concern listed above.  Wynn Banker 01/14/2012, 12:45 PM
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
## 2012-01-15 NOTE — Discharge Summary (Signed)
Reviewed

## 2012-01-18 NOTE — Progress Notes (Signed)
Patient Discharge Instructions:  After Visit Summary (AVS):   Faxed to:  01/18/12 Psychiatric Admission Assessment Note:   Faxed to:  01/18/12 Suicide Risk Assessment - Discharge Assessment:   Faxed to:  01/18/12 Faxed/Sent to the Next Level Care provider:  01/18/12 Faxed to [facility] @ [phone]  Jerelene Redden, 01/18/2012, 1:46 PM

## 2012-05-09 ENCOUNTER — Ambulatory Visit: Payer: Self-pay

## 2012-07-31 ENCOUNTER — Emergency Department: Payer: Self-pay | Admitting: Unknown Physician Specialty

## 2012-08-27 ENCOUNTER — Encounter (HOSPITAL_COMMUNITY): Payer: Self-pay | Admitting: Emergency Medicine

## 2012-08-27 ENCOUNTER — Emergency Department (HOSPITAL_COMMUNITY)
Admission: EM | Admit: 2012-08-27 | Discharge: 2012-08-27 | Disposition: A | Discharge status: Home / Self Care | Payer: No Typology Code available for payment source | Attending: Emergency Medicine | Admitting: Emergency Medicine

## 2012-08-27 DIAGNOSIS — F411 Generalized anxiety disorder: Secondary | ICD-10-CM | POA: Diagnosis not present

## 2012-08-27 DIAGNOSIS — M542 Cervicalgia: Secondary | ICD-10-CM | POA: Insufficient documentation

## 2012-08-27 DIAGNOSIS — Z8679 Personal history of other diseases of the circulatory system: Secondary | ICD-10-CM | POA: Insufficient documentation

## 2012-08-27 DIAGNOSIS — M545 Low back pain: Secondary | ICD-10-CM | POA: Diagnosis present

## 2012-08-27 DIAGNOSIS — M549 Dorsalgia, unspecified: Secondary | ICD-10-CM

## 2012-08-27 DIAGNOSIS — IMO0001 Reserved for inherently not codable concepts without codable children: Secondary | ICD-10-CM | POA: Diagnosis not present

## 2012-08-27 DIAGNOSIS — Y929 Unspecified place or not applicable: Secondary | ICD-10-CM | POA: Insufficient documentation

## 2012-08-27 DIAGNOSIS — Y9389 Activity, other specified: Secondary | ICD-10-CM | POA: Insufficient documentation

## 2012-08-27 DIAGNOSIS — F3289 Other specified depressive episodes: Secondary | ICD-10-CM | POA: Insufficient documentation

## 2012-08-27 DIAGNOSIS — F329 Major depressive disorder, single episode, unspecified: Secondary | ICD-10-CM | POA: Insufficient documentation

## 2012-08-27 DIAGNOSIS — Z79899 Other long term (current) drug therapy: Secondary | ICD-10-CM | POA: Insufficient documentation

## 2012-08-27 DIAGNOSIS — M62838 Other muscle spasm: Secondary | ICD-10-CM | POA: Diagnosis not present

## 2012-08-27 MED ORDER — OXYCODONE-ACETAMINOPHEN 5-325 MG PO TABS
1.0000 | ORAL_TABLET | Freq: Once | ORAL | Status: AC
  Administered 2012-08-27: 1 via ORAL
  Filled 2012-08-27: qty 1

## 2012-08-27 MED ORDER — TRAMADOL HCL 50 MG PO TABS: 50.0000 mg | ORAL_TABLET | Freq: Four times a day (QID) | ORAL | Status: DC | PRN
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
## 2012-08-27 NOTE — ED Notes (Signed)
Pt dc to home. Pt sts understanding to dc instructions. Pt ambulatory to exit without difficulty.  Pt denies need for w/c.  

## 2012-08-27 NOTE — ED Provider Notes (Signed)
Medical screening examination/treatment/procedure(s) were performed by non-physician practitioner and as supervising physician I was immediately available for consultation/collaboration.  Sunnie Nielsen, MD 08/27/12 [phone]

## 2012-08-28 ENCOUNTER — Encounter: Payer: Self-pay | Admitting: Physical Medicine & Rehabilitation

## 2012-10-02 ENCOUNTER — Ambulatory Visit: Payer: Self-pay | Admitting: Physical Medicine & Rehabilitation

## 2014-06-06 NOTE — H&P (Signed)
PATIENT NAME:  Colleen Delacruz, Colleen Delacruz MR#:  [id number] DATE OF BIRTH:  [birth year]  DATE OF ADMISSION:  04/06/2011  PRIMARY CARE PHYSICIAN: Dr. Dennison Mascotracy [place]il[place] in [place]    CHIEF COMPLAINT: Chest pain.   HISTORY OF PRESENT ILLNESS: Ms. Pecola LeisureManess is a 54 year old Caucasian female who is passing through a stress at home these days. She reported that around 1 in the morning she had some headache and then she developed midsternal chest pain radiating to the neck and left arm. The pain was described as shooting pain. It lasts for about five minutes and then comes back associated with some shortness of breath. She reports no vomiting but mild nausea. She has no prior history of chest pain.  REVIEW OF SYSTEMS: CONSTITUTIONAL: Denies any fever. No chills. No night sweats. No fatigue. EYES: No blurring of vision. No double vision. ENT: No hearing impairment. No sore throat. No dysphagia. CARDIOVASCULAR: Reports chest pain and mild shortness of breath. No edema. No syncope. RESPIRATORY: No cough. No sputum production. Reports chest pain as above. GI: Mild nausea. No vomiting. No abdominal pain. No diarrhea. GENITOURINARY: No dysuria or frequency of urination. MUSCULOSKELETAL: No joint swelling or pain. No muscular pain or swelling. INTEGUMENTARY: No skin rash. No ulcers. NEUROLOGIC: No focal weakness. No seizure activity. She had headache earlier but eased off. PSYCHIATRY: Looks depressed. She has history of anxiety. She takes Xanax p.r.n. ENDOCRINE: No night sweats. No heat or cold intolerance.   PAST MEDICAL HISTORY: History of migraine. No prior history of hypertension, diabetes, or coronary artery disease.   PAST SURGICAL HISTORY: History of tubal ligation.   SOCIAL HABITS: Nonsmoker. No history of alcohol or drug abuse.   ALLERGIES: No known drug allergies.   MEDICATIONS: 1. She takes medicine for menopause, does not recall the name. 2. Zoloft. She thinks it is 25 mg once a day.  3. Xanax p.r.n.   SOCIAL  HISTORY: She is married but in the process of separation stating as of this Sunday.   FAMILY HISTORY: Her father had coronary disease in his late 6260's.   PHYSICAL EXAMINATION:   VITAL SIGNS: Blood pressure 126/61, respiratory rate 18, pulse 80, temperature 96.7, oxygen saturation 98% on room air.   GENERAL APPEARANCE: Middle-aged female laying in bed in no acute distress.   HEAD AND NECK: Unremarkable. No pallor. No icterus. No cyanosis.   EARS, NOSE, AND THROAT: Hearing was normal. Nasal mucosa, lips, tongue were normal.   EYES: Normal eyelids and conjunctivae. Pupils about 5 mm, equal and reactive to light.   NECK: Supple. Trachea at midline. No thyromegaly. No masses.   HEART: Normal S1, S2. No S3, S4. No murmur. No gallop. No carotid bruits.   RESPIRATORY: Normal breathing pattern without use of accessory muscles. No rales. No wheezing.   ABDOMEN: Soft without tenderness. No hepatosplenomegaly. No masses. No hernias.   SKIN: No ulcers. No subcutaneous nodules.   MUSCULOSKELETAL: No joint swelling. No clubbing.   NEUROLOGIC: Cranial nerves II through XII are intact. No focal motor deficit.   PSYCH: The patient is alert and oriented x3. Mood appears to be depressed.   LABORATORY, DIAGNOSTIC, AND RADIOLOGICAL DATA: EKG showed normal sinus rhythm at rate of 68 per minute, unremarkable EKG.   Serum glucose 92, BUN 8, creatinine 0.7, sodium 146, potassium 3.2. CPK 145. Troponin less than 0.02. CBC showed white count 6000, hemoglobin 13, hematocrit 38, platelet count 271.   ASSESSMENT:  1. Recurrent midsternal chest pain. 2. Hypokalemia.  3. The patient appears  stressed and passing through marital problems.  4. History of migraine headaches.  5. Anxiety disorder.   PLAN:  1. Admit the patient for observation on telemetry.  2. Follow-up on cardiac enzymes.  3. Plan for nuclear stress test.  4. Place patient on aspirin 325 mg a day.  5. Deep vein thrombosis prophylaxis with  Lovenox 40 mg a day.  6. Peptic ulcer disease prophylaxis with Protonix 40 mg a day.  7. I will increase her Zoloft to 50 mg a day.  8. Xanax p.r.n.   ____________________________ Carney Corners. Rudene Re, MD amd:drc D: 04/06/2011 06:51:40 ET T: 04/06/2011 07:08:48 ET JOB#: [id number]  cc: Carney Corners. Rudene Re, MD, <Dictator> Dr. Dennison Mascot in [facility] Judie Petit Kimmie Berggren MD ELECTRONICALLY SIGNED 04/06/2011 22:26

## 2014-06-06 NOTE — Discharge Summary (Signed)
PATIENT NAME:  Colleen Delacruz, Colleen Delacruz MR#:  [id number] DATE OF BIRTH:  Apr 08, 1960  DATE OF ADMISSION:  04/06/2011 DATE OF DISCHARGE:  04/06/2011  For a detailed note, please take a look at the history and physical done on admission by Dr. Rudene Rearwish.   DIAGNOSES AT DISCHARGE:  1. Chest pain likely related to anxiety/depression.  2. Anxiety/depression.   DIET: The patient is being discharged on a regular diet.   ACTIVITY: As tolerated.   FOLLOW-UP: The patient is to follow-up with her primary care physician in the next 1 to 2 weeks in [place].   DISCHARGE MEDICATIONS:  1. Zoloft 25 mg daily.  2. Xanax 1 mg t.i.d. as needed.   PERTINENT STUDIES DONE DURING THE HOSPITAL COURSE: Chest x-ray done on admission showing no acute cardiopulmonary disease. Nuclear stress myocardial scan done showing negative Lexiscan infusion, LV function normal. No evidence of infarction or ischemia.   HOSPITAL COURSE: This is a 54 year old female with medical problems as mentioned above who presented to the hospital with chest pain.  1. Chest pain. The patient was observed in the hospital, was on telemetry, had three sets of cardiac markers checked which were negative. She did not have any evidence of acute arrhythmia on telemetry either. She underwent a stress test which showed normal LV function with no evidence of any cardiac ischemia. The patient's chest pain was thought to be related to anxiety. She was told to continue her Xanax and Zoloft until follow-up with her psychiatrist. 2. Anxiety/depression. She was maintained on Xanax and Zoloft and told to resume that upon discharge.   TIME SPENT WITH THE DISCHARGE: 35 minutes.   ____________________________ Rolly PancakeVivek J. Cherlynn KaiserSainani, MD vjs:drc D: 04/14/2011 21:53:20 ET T: 04/15/2011 14:23:28 ET JOB#: [id number]  cc: Rolly PancakeVivek J. Cherlynn KaiserSainani, MD, <Dictator> Houston SirenVIVEK J Sanjiv Castorena MD ELECTRONICALLY SIGNED 04/16/2011 13:27

## 2014-09-04 ENCOUNTER — Encounter: Payer: Self-pay | Admitting: Emergency Medicine

## 2014-09-04 ENCOUNTER — Emergency Department: Payer: Medicaid Other

## 2014-09-04 ENCOUNTER — Inpatient Hospital Stay
Admission: EM | Admit: 2014-09-04 | Discharge: 2014-09-09 | DRG: 603 | Disposition: A | Discharge status: Home / Self Care | Payer: Self-pay | Attending: Internal Medicine | Admitting: Internal Medicine

## 2014-09-04 DIAGNOSIS — Z9851 Tubal ligation status: Secondary | ICD-10-CM

## 2014-09-04 DIAGNOSIS — Z6830 Body mass index (BMI) 30.0-30.9, adult: Secondary | ICD-10-CM

## 2014-09-04 DIAGNOSIS — E669 Obesity, unspecified: Secondary | ICD-10-CM | POA: Diagnosis present

## 2014-09-04 DIAGNOSIS — R509 Fever, unspecified: Secondary | ICD-10-CM

## 2014-09-04 DIAGNOSIS — K219 Gastro-esophageal reflux disease without esophagitis: Secondary | ICD-10-CM | POA: Diagnosis present

## 2014-09-04 DIAGNOSIS — L729 Follicular cyst of the skin and subcutaneous tissue, unspecified: Secondary | ICD-10-CM | POA: Diagnosis present

## 2014-09-04 DIAGNOSIS — Z791 Long term (current) use of non-steroidal anti-inflammatories (NSAID): Secondary | ICD-10-CM

## 2014-09-04 DIAGNOSIS — B373 Candidiasis of vulva and vagina: Secondary | ICD-10-CM | POA: Diagnosis not present

## 2014-09-04 DIAGNOSIS — L03211 Cellulitis of face: Principal | ICD-10-CM | POA: Diagnosis present

## 2014-09-04 DIAGNOSIS — E876 Hypokalemia: Secondary | ICD-10-CM | POA: Diagnosis not present

## 2014-09-04 DIAGNOSIS — Z79899 Other long term (current) drug therapy: Secondary | ICD-10-CM

## 2014-09-04 DIAGNOSIS — Z8614 Personal history of Methicillin resistant Staphylococcus aureus infection: Secondary | ICD-10-CM

## 2014-09-04 LAB — CBC WITH DIFFERENTIAL/PLATELET
BASOS ABS: 0 10*3/uL (ref 0–0.1)
Basophils Relative: 0 %
EOS ABS: 0.1 10*3/uL (ref 0–0.7)
Eosinophils Relative: 1 %
HCT: 39.1 % (ref 35.0–47.0)
Hemoglobin: 13 g/dL (ref 12.0–16.0)
LYMPHS ABS: 1.9 10*3/uL (ref 1.0–3.6)
LYMPHS PCT: 15 %
MCH: 28.4 pg (ref 26.0–34.0)
MCHC: 33.3 g/dL (ref 32.0–36.0)
MCV: 85.3 fL (ref 80.0–100.0)
Monocytes Absolute: 0.6 10*3/uL (ref 0.2–0.9)
Monocytes Relative: 5 %
NEUTROS PCT: 79 %
Neutro Abs: 10.6 10*3/uL — ABNORMAL HIGH (ref 1.4–6.5)
PLATELETS: 274 10*3/uL (ref 150–440)
RBC: 4.58 MIL/uL (ref 3.80–5.20)
RDW: 13.8 % (ref 11.5–14.5)
WBC: 13.3 10*3/uL — AB (ref 3.6–11.0)

## 2014-09-04 LAB — BASIC METABOLIC PANEL
Anion gap: 10 (ref 5–15)
BUN: 10 mg/dL (ref 6–20)
CALCIUM: 8.5 mg/dL — AB (ref 8.9–10.3)
CO2: 23 mmol/L (ref 22–32)
Chloride: 102 mmol/L (ref 101–111)
Creatinine, Ser: 0.99 mg/dL (ref 0.44–1.00)
GFR calc Af Amer: >60 mL/min (ref >60)
GFR calc non Af Amer: >60 mL/min (ref >60)
GLUCOSE: 117 mg/dL — AB (ref 65–99)
POTASSIUM: 3 mmol/L — AB (ref 3.5–5.1)
SODIUM: 135 mmol/L (ref 135–145)

## 2014-09-04 MED ORDER — MORPHINE SULFATE 2 MG/ML IJ SOLN
2.0000 mg | Freq: Once | INTRAMUSCULAR | Status: AC
  Administered 2014-09-04: 2 mg via INTRAVENOUS
  Filled 2014-09-04: qty 1

## 2014-09-04 MED ORDER — IOHEXOL 300 MG/ML  SOLN
75.0000 mL | Freq: Once | INTRAMUSCULAR | Status: AC | PRN
Start: 2014-09-04 — End: 2014-09-04
  Administered 2014-09-04: 75 mL via INTRAVENOUS
  Filled 2014-09-04: qty 75

## 2014-09-04 MED ORDER — HYDROCODONE-ACETAMINOPHEN 5-325 MG PO TABS
1.0000 | ORAL_TABLET | ORAL | Status: AC
  Administered 2014-09-04: 1 via ORAL
  Filled 2014-09-04: qty 1

## 2014-09-04 MED ORDER — CLINDAMYCIN PHOSPHATE 600 MG/50ML IV SOLN
600.0000 mg | Freq: Once | INTRAVENOUS | Status: AC
  Administered 2014-09-04: 600 mg via INTRAVENOUS
  Filled 2014-09-04: qty 50

## 2014-09-04 NOTE — ED Notes (Signed)
Pt. Presents to triage with swelling to left side of face.  Pt. States "I have had black/blue dot on lt. Cheek for the past twenty years".  Pt. States yesterday morning area became hard.  "pt states I thought it was a pimple and used needle to probe it.  Pt. States this morning swelling started to affected area.

## 2014-09-04 NOTE — ED Provider Notes (Signed)
CSN: [id number]     Arrival date & time 09/04/14  1728 History   First MD Initiated Contact with Patient 09/04/14 1935     Chief Complaint  Patient presents with  . Facial Swelling    Facial swelling to lt. side of face.     (Consider location/radiation/quality/duration/timing/severity/associated sxs/prior Treatment) HPI  54 year old female presents today for evaluation of left facial swelling and pain. 2 days ago patient felt a bump along her left cheek. She stuck a pin into this area and developed increased pain and swelling the next morning. Since, patient has had increased pain and swelling warmth and redness. Pain is moderate to severe. She has had chills with subjective fever. No trouble swallowing. No eye pain with range of motion. No vision changes.  Past Medical History  Diagnosis Date  . Migraines   . Depression   . Anxiety    Past Surgical History  Procedure Laterality Date  . Tubal ligation     History reviewed. No pertinent family history. History  Substance Use Topics  . Smoking status: Never Smoker   . Smokeless tobacco: Never Used  . Alcohol Use: No   OB History    Gravida Para Term Preterm AB TAB SAB Ectopic Multiple Living   1 1 1       1      Review of Systems  Constitutional: Positive for fever and chills. Negative for activity change and fatigue.  HENT: Positive for facial swelling. Negative for congestion, sinus pressure and sore throat.   Eyes: Negative for visual disturbance.  Respiratory: Negative for cough, chest tightness and shortness of breath.   Cardiovascular: Negative for chest pain and leg swelling.  Gastrointestinal: Negative for nausea, vomiting, abdominal pain and diarrhea.  Genitourinary: Negative for dysuria.  Musculoskeletal: Negative for arthralgias and gait problem.  Skin: Positive for color change. Negative for rash.  Neurological: Negative for weakness, numbness and headaches.  Hematological: Negative for adenopathy.   Psychiatric/Behavioral: Negative for behavioral problems, confusion and agitation.      Allergies  Review of patient's allergies indicates no known allergies.  Home Medications   Prior to Admission medications   Medication Sig Start Date End Date Taking? Authorizing Provider  ibuprofen (ADVIL,MOTRIN) 200 MG tablet Take 2 tablets (400 mg total) by mouth every 6 (six) hours as needed. For migraine headaches 01/14/12  Yes Sanjuana Kava, NP  divalproex (DEPAKOTE ER) 500 MG 24 hr tablet Take 1 tablet (500 mg total) by mouth 2 (two) times daily. For mood stabilization 01/14/12   Sanjuana Kava, NP  gabapentin (NEURONTIN) 100 MG capsule Take 2 capsules (200 mg total) by mouth 3 (three) times daily. For anxiety 01/14/12   Sanjuana Kava, NP  risperiDONE (RISPERDAL) 1 MG tablet Take 1 tablet (1 mg total) by mouth at bedtime. For mood control 01/14/12   Sanjuana Kava, NP  traMADol (ULTRAM) 50 MG tablet Take 1 tablet (50 mg total) by mouth every 6 (six) hours as needed for pain. 08/27/12   Jennifer Piepenbrink, PA-C  traZODone (DESYREL) 100 MG tablet Take 1 tablet (100 mg total) by mouth at bedtime and may repeat dose one time if needed. For sleep 01/14/12   Sanjuana Kava, NP   BP 120/77 mmHg  Pulse 96  Temp(Src) 99 F (37.2 C) (Oral)  Resp 18  Ht 5\' 8"  (1.727 m)  Wt 200 lb (90.719 kg)  BMI 30.42 kg/m2  SpO2 97% Physical Exam  Constitutional: She is oriented to person,  place, and time. She appears well-developed and well-nourished. No distress.  HENT:  Head: Normocephalic and atraumatic.  Mouth/Throat: Oropharynx is clear and moist.  Eyes: EOM are normal. Pupils are equal, round, and reactive to light. Right eye exhibits no discharge. Left eye exhibits no discharge.  No pain with extraocular movement.  Neck: Normal range of motion. Neck supple.  Cardiovascular: Normal rate, regular rhythm and intact distal pulses.   Pulmonary/Chest: Effort normal and breath sounds normal. No respiratory distress.  She exhibits no tenderness.  Abdominal: Soft. She exhibits no distension. There is no tenderness.  Musculoskeletal: Normal range of motion. She exhibits no edema.  Neurological: She is alert and oriented to person, place, and time. She has normal reflexes.  Skin: Skin is warm and dry.  Patient has a large area of induration extending from the left lower eyelid down to the mandible. Over the maxillary sinus there is significant induration with tenderness to palpation. No area of fluctuance.   Psychiatric: She has a normal mood and affect. Her behavior is normal. Thought content normal.    ED Course  Procedures (including critical care time) Labs Review Labs Reviewed  CBC WITH DIFFERENTIAL/PLATELET - Abnormal; Notable for the following:    WBC 13.3 (*)    Neutro Abs 10.6 (*)    All other components within normal limits  BASIC METABOLIC PANEL - Abnormal; Notable for the following:    Potassium 3.0 (*)    Glucose, Bld 117 (*)    Calcium 8.5 (*)    All other components within normal limits  CULTURE, BLOOD (ROUTINE X 2)  CULTURE, BLOOD (ROUTINE X 2)    Imaging Review Ct Maxillofacial W/cm  09/04/2014   CLINICAL DATA:  Acute onset of left-sided facial swelling, after probing focal skin discoloration with needle. Initial encounter.  EXAM: CT MAXILLOFACIAL WITH CONTRAST  TECHNIQUE: Multidetector CT imaging of the maxillofacial structures was performed with intravenous contrast. Multiplanar CT image reconstructions were also generated. A small metallic BB was placed on the right temple in order to reliably differentiate right from left.  CONTRAST:  75mL OMNIPAQUE IOHEXOL 300 MG/ML  SOLN  COMPARISON:  CT of the head performed 10/24/2012  FINDINGS: There is no evidence of fracture or dislocation. The maxilla and mandible appear intact. The nasal bone is unremarkable in appearance. The visualized dentition demonstrates no acute abnormality.  The orbits are intact bilaterally. Mucosal thickening is  noted at the right maxillary sinus. The patient is status post right-sided maxillary antrectomy. The remaining visualized paranasal sinuses and mastoid air cells are well-aerated.  Soft tissue swelling is noted along the left side of the face, tracking from the left eyelid inferiorly to the level of the mandible, including the left upper lip. There is minimal associated skin thickening. Inflammation is slightly more focal at the level of the inferior aspect of the left maxillary sinus, and tracking inferiorly along the platysma, but there is no evidence of abscess. No foreign bodies are identified.  The parapharyngeal fat planes are preserved. The nasopharynx, oropharynx and hypopharynx are unremarkable in appearance. The visualized portions of the valleculae are grossly unremarkable.  The parotid and submandibular glands are within normal limits. No cervical lymphadenopathy is seen.  IMPRESSION: 1. Soft tissue swelling along the left side of the face, tracking from the left eyelid inferiorly to the level of the mandible, including the left upper lip. Minimal associated skin thickening. Inflammation is slightly more focal at the level of the inferior aspect of the left maxillary  sinus, and tracking inferiorly along the platysma, but there is no evidence of abscess at this time. No foreign bodies seen. 2. No evidence of fracture or dislocation with regard to the maxillofacial structures. 3. Mucosal thickening at the right maxillary sinus.   Electronically Signed   By: Roanna Raider M.D.   On: 09/04/2014 21:36     EKG Interpretation None      MDM   Final diagnoses:  Facial cellulitis  Fever, unspecified fever cause    54 year old female with extensive cellulitis to the left cheek with fevers and leukocytosis. CT scan showed no abscess but extensive cellulitis tracking from the lower lid down to the mandible. We will admit for IV antibiotic since. Case discussed with prime doctor.    Evon Slack, PA-C 09/04/14 2246  Jene Every, MD 09/04/14 2258

## 2014-09-05 DIAGNOSIS — L03211 Cellulitis of face: Secondary | ICD-10-CM | POA: Diagnosis present

## 2014-09-05 LAB — COMPREHENSIVE METABOLIC PANEL
ALT: 10 U/L — ABNORMAL LOW (ref 14–54)
ANION GAP: 5 (ref 5–15)
AST: 17 U/L (ref 15–41)
Albumin: 3.6 g/dL (ref 3.5–5.0)
Alkaline Phosphatase: 78 U/L (ref 38–126)
BUN: 9 mg/dL (ref 6–20)
CHLORIDE: 110 mmol/L (ref 101–111)
CO2: 26 mmol/L (ref 22–32)
Calcium: 8.4 mg/dL — ABNORMAL LOW (ref 8.9–10.3)
Creatinine, Ser: 0.84 mg/dL (ref 0.44–1.00)
GFR calc Af Amer: >60 mL/min (ref >60)
GFR calc non Af Amer: >60 mL/min (ref >60)
Glucose, Bld: 102 mg/dL — ABNORMAL HIGH (ref 65–99)
Potassium: 3.7 mmol/L (ref 3.5–5.1)
SODIUM: 141 mmol/L (ref 135–145)
Total Bilirubin: 0.3 mg/dL (ref 0.3–1.2)
Total Protein: 6.4 g/dL — ABNORMAL LOW (ref 6.5–8.1)

## 2014-09-05 LAB — CBC
HCT: 37.2 % (ref 35.0–47.0)
Hemoglobin: 12.6 g/dL (ref 12.0–16.0)
MCH: 28.9 pg (ref 26.0–34.0)
MCHC: 34 g/dL (ref 32.0–36.0)
MCV: 85.2 fL (ref 80.0–100.0)
Platelets: 256 10*3/uL (ref 150–440)
RBC: 4.37 MIL/uL (ref 3.80–5.20)
RDW: 13.7 % (ref 11.5–14.5)
WBC: 10.3 10*3/uL (ref 3.6–11.0)

## 2014-09-05 MED ORDER — SODIUM CHLORIDE 0.9 % IJ SOLN: 3.0000 mL | INTRAMUSCULAR | Status: DC | PRN

## 2014-09-05 MED ORDER — IBUPROFEN 400 MG PO TABS
400.0000 mg | ORAL_TABLET | Freq: Four times a day (QID) | ORAL | Status: DC | PRN
  Administered 2014-09-06: 400 mg via ORAL
  Filled 2014-09-05 (×2): qty 1

## 2014-09-05 MED ORDER — SODIUM CHLORIDE 0.9 % IJ SOLN
3.0000 mL | Freq: Two times a day (BID) | INTRAMUSCULAR | Status: DC
  Administered 2014-09-05 – 2014-09-09 (×9): 3 mL via INTRAVENOUS

## 2014-09-05 MED ORDER — VANCOMYCIN HCL IN DEXTROSE 1-5 GM/200ML-% IV SOLN
1000.0000 mg | Freq: Two times a day (BID) | INTRAVENOUS | Status: DC
  Administered 2014-09-05 – 2014-09-09 (×8): 1000 mg via INTRAVENOUS
  Filled 2014-09-05 (×12): qty 200

## 2014-09-05 MED ORDER — BISACODYL 5 MG PO TBEC: 5.0000 mg | DELAYED_RELEASE_TABLET | Freq: Every day | ORAL | Status: DC | PRN

## 2014-09-05 MED ORDER — SODIUM CHLORIDE 0.9 % IV SOLN: 250.0000 mL | INTRAVENOUS | Status: DC | PRN

## 2014-09-05 MED ORDER — ACETAMINOPHEN 650 MG RE SUPP: 650.0000 mg | Freq: Four times a day (QID) | RECTAL | Status: DC | PRN

## 2014-09-05 MED ORDER — MORPHINE SULFATE 2 MG/ML IJ SOLN
2.0000 mg | INTRAMUSCULAR | Status: DC | PRN
  Administered 2014-09-05 – 2014-09-07 (×12): 2 mg via INTRAVENOUS
  Filled 2014-09-05 (×13): qty 1

## 2014-09-05 MED ORDER — ONDANSETRON HCL 4 MG PO TABS: 4.0000 mg | ORAL_TABLET | Freq: Four times a day (QID) | ORAL | Status: DC | PRN

## 2014-09-05 MED ORDER — ONDANSETRON HCL 4 MG/2ML IJ SOLN: 4.0000 mg | Freq: Four times a day (QID) | INTRAMUSCULAR | Status: DC | PRN

## 2014-09-05 MED ORDER — POLYETHYLENE GLYCOL 3350 17 G PO PACK: 17.0000 g | PACK | Freq: Every day | ORAL | Status: DC | PRN

## 2014-09-05 MED ORDER — CLINDAMYCIN PHOSPHATE 600 MG/50ML IV SOLN
600.0000 mg | Freq: Three times a day (TID) | INTRAVENOUS | Status: DC
  Administered 2014-09-05: 600 mg via INTRAVENOUS
  Filled 2014-09-05 (×5): qty 50

## 2014-09-05 MED ORDER — PANTOPRAZOLE SODIUM 40 MG PO TBEC
40.0000 mg | DELAYED_RELEASE_TABLET | Freq: Every day | ORAL | Status: DC
  Administered 2014-09-05 – 2014-09-09 (×5): 40 mg via ORAL
  Filled 2014-09-05 (×5): qty 1

## 2014-09-05 MED ORDER — DOCUSATE SODIUM 100 MG PO CAPS
100.0000 mg | ORAL_CAPSULE | Freq: Two times a day (BID) | ORAL | Status: DC
  Administered 2014-09-05 – 2014-09-09 (×7): 100 mg via ORAL
  Filled 2014-09-05 (×9): qty 1

## 2014-09-05 MED ORDER — POTASSIUM CHLORIDE 20 MEQ PO PACK
40.0000 meq | PACK | Freq: Once | ORAL | Status: AC
  Administered 2014-09-05: 40 meq via ORAL
  Filled 2014-09-05: qty 2

## 2014-09-05 MED ORDER — TETANUS-DIPHTH-ACELL PERTUSSIS 5-2.5-18.5 LF-MCG/0.5 IM SUSP
0.5000 mL | Freq: Once | INTRAMUSCULAR | Status: AC
  Administered 2014-09-05: 0.5 mL via INTRAMUSCULAR
  Filled 2014-09-05: qty 0.5

## 2014-09-05 MED ORDER — PIPERACILLIN-TAZOBACTAM 3.375 G IVPB 30 MIN: 3.3750 g | Freq: Once | INTRAVENOUS | Status: DC

## 2014-09-05 MED ORDER — ACETAMINOPHEN 325 MG PO TABS: 650.0000 mg | ORAL_TABLET | Freq: Four times a day (QID) | ORAL | Status: DC | PRN

## 2014-09-05 MED ORDER — ALUM & MAG HYDROXIDE-SIMETH 200-200-20 MG/5ML PO SUSP: 30.0000 mL | Freq: Four times a day (QID) | ORAL | Status: DC | PRN

## 2014-09-05 MED ORDER — PIPERACILLIN-TAZOBACTAM 3.375 G IVPB
3.3750 g | Freq: Three times a day (TID) | INTRAVENOUS | Status: DC
  Administered 2014-09-05 – 2014-09-09 (×12): 3.375 g via INTRAVENOUS
  Filled 2014-09-05 (×19): qty 50

## 2014-09-05 MED ORDER — VANCOMYCIN HCL IN DEXTROSE 1-5 GM/200ML-% IV SOLN
1000.0000 mg | Freq: Once | INTRAVENOUS | Status: AC
  Administered 2014-09-05: 1000 mg via INTRAVENOUS
  Filled 2014-09-05 (×2): qty 200

## 2014-09-05 NOTE — Plan of Care (Addendum)
Problem: Discharge Progression Outcomes Goal: Wound improving/decreased edema Outcome: Progressing Wound care consult in for cellulitis of face will probably see tomorrow not here over weekends,  Pain improves with PRN medications, tolerating diet well, vss, started on IV Zosyn and vancomycin as well.

## 2014-09-05 NOTE — Progress Notes (Addendum)
ANTIBIOTIC CONSULT NOTE - INITIAL  Pharmacy Consult for Vancomycin and Zosyn Indication: Cellulitis  No Known Allergies  Patient Measurements: Height:  (172.7 cm) Weight: 200 lb (90.719 kg) IBW/kg (Calculated) : 63.9 Adjusted Body Weight: 74.62  Vital Signs: Temp: 98.3 F (36.8 C) (07/24 1430) Temp Source: Oral (07/24 1430) BP: 95/66 mmHg (07/24 1430) Pulse Rate: 68 (07/24 1430)  Labs:  Recent Labs  09/04/14 2023 09/05/14 0506  WBC 13.3* 10.3  HGB 13.0 12.6  PLT 274 256  CREATININE 0.99 0.84   Estimated Creatinine Clearance: 91.2 mL/min (by C-G formula based on Cr of 0.84). No results for input(s): VANCOTROUGH, VANCOPEAK, VANCORANDOM, GENTTROUGH, GENTPEAK, GENTRANDOM, TOBRATROUGH, TOBRAPEAK, TOBRARND, AMIKACINPEAK, AMIKACINTROU, AMIKACIN in the last 72 hours.   Microbiology: Recent Results (from the past 720 hour(s))  Blood culture (routine x 2)     Status: None (Preliminary result)   Collection Time: 09/04/14  8:23 PM  Result Value Ref Range Status   Specimen Description BLOOD  Final   Special Requests BOTTLES DRAWN AEROBIC AND ANAEROBIC 5CC  Final   Culture NO GROWTH < 12 HOURS  Final   Report Status PENDING  Incomplete  Blood culture (routine x 2)     Status: None (Preliminary result)   Collection Time: 09/04/14  8:23 PM  Result Value Ref Range Status   Specimen Description BLOOD  Final   Special Requests BOTTLES DRAWN AEROBIC AND ANAEROBIC 5CC  Final   Culture NO GROWTH < 12 HOURS  Final   Report Status PENDING  Incomplete    Medical History: Past Medical History  Diagnosis Date  . Migraines   . Depression   . Anxiety     Goal of Therapy:  Vancomycin trough level 10-15 mcg/ml  Plan:  Zosyn:  CrCl >> 20 - Ordered Zosyn 3.375 gm EI q8hr.  First dose hung at 13:27 today.  Vancomycin:  CrCl~ 78.13, ke = 0.069, T 1/2 = 10hr, VD = 52.2.  Ordered Vancomycin 1000 mg IV once.  Will order Vancomycin 1000 mg IV q12hr  starting 6 hours after first dose  (stacked dose) has been hung and first trough prior to 4th dose.  Usher Hedberg,Natha S 09/05/2014,3:14 PM

## 2014-09-05 NOTE — Progress Notes (Signed)
Patient ID: Colleen Delacruz, female   DOB: [birth year], 54 y.o.   MRN: [id number] [facility] PROGRESS NOTE  HPI/Subjective: The patient had a black spot on her left cheek for about 19 years. She felt swelling underneath it. She tried to relieve some of the pressure by sticking of pain and it. Her face swelled up and could hardly open her up her left eye yesterday. She came into the ER for further evaluation was found to have a left facial cellulitis.  Objective: Filed Vitals:   09/05/14 0533  BP: 105/62  Pulse: 69  Temp: 98.8 F (37.1 C)  Resp: 20    Filed Weights   09/04/14 1752  Weight: 90.719 kg (200 lb)    ROS: Review of Systems  Constitutional: Negative for fever and chills.  Eyes: Negative for blurred vision.  Respiratory: Negative for cough and shortness of breath.   Cardiovascular: Negative for chest pain.  Gastrointestinal: Negative for nausea, vomiting, abdominal pain, diarrhea and constipation.  Genitourinary: Negative for dysuria.  Musculoskeletal: Negative for joint pain.  Neurological: Negative for dizziness and headaches.   Exam: Physical Exam  Constitutional: She is oriented to person, place, and time.  HENT:  Nose: No mucosal edema.  Mouth/Throat: No oropharyngeal exudate or posterior oropharyngeal edema.  Eyes: Conjunctivae, EOM and lids are normal. Pupils are equal, round, and reactive to light.  Neck: No JVD present. Carotid bruit is not present. No edema present. No thyroid mass and no thyromegaly present.  Cardiovascular: S1 normal and S2 normal.  Exam reveals no gallop.   No murmur heard. Pulses:      Dorsalis pedis pulses are 2+ on the right side, and 2+ on the left side.  Respiratory: No respiratory distress. She has no wheezes. She has no rhonchi. She has no rales.  GI: Soft. Bowel sounds are normal. There is no tenderness.  Musculoskeletal:       Right ankle: She exhibits no swelling.       Left ankle: She exhibits no swelling.   Lymphadenopathy:    She has cervical adenopathy.       Left cervical: Superficial cervical and posterior cervical adenopathy present.  Neurological: She is alert and oriented to person, place, and time. No cranial nerve deficit.  Skin: Skin is warm. No rash noted. Nails show no clubbing.  Left facial swelling and erythema. Pain to palpation. Fullness in left cheek.  Psychiatric: She has a normal mood and affect.    Data Reviewed: Basic Metabolic Panel:  Recent Labs Lab 09/04/14 2023 09/05/14 0506  NA 135 141  K 3.0* 3.7  CL 102 110  CO2 23 26  GLUCOSE 117* 102*  BUN 10 9  CREATININE 0.99 0.84  CALCIUM 8.5* 8.4*   Liver Function Tests:  Recent Labs Lab 09/05/14 0506  AST 17  ALT 10*  ALKPHOS 78  BILITOT 0.3  PROT 6.4*  ALBUMIN 3.6   CBC:  Recent Labs Lab 09/04/14 2023 09/05/14 0506  WBC 13.3* 10.3  NEUTROABS 10.6*  --   HGB 13.0 12.6  HCT 39.1 37.2  MCV 85.3 85.2  PLT 274 256    Recent Results (from the past 240 hour(s))  Blood culture (routine x 2)     Status: None (Preliminary result)   Collection Time: 09/04/14  8:23 PM  Result Value Ref Range Status   Specimen Description BLOOD  Final   Special Requests BOTTLES DRAWN AEROBIC AND ANAEROBIC 5CC  Final   Culture NO GROWTH <  12 HOURS  Final   Report Status PENDING  Incomplete  Blood culture (routine x 2)     Status: None (Preliminary result)   Collection Time: 09/04/14  8:23 PM  Result Value Ref Range Status   Specimen Description BLOOD  Final   Special Requests BOTTLES DRAWN AEROBIC AND ANAEROBIC 5CC  Final   Culture NO GROWTH < 12 HOURS  Final   Report Status PENDING  Incomplete     Studies: Ct Maxillofacial W/cm  09/04/2014   CLINICAL DATA:  Acute onset of left-sided facial swelling, after probing focal skin discoloration with needle. Initial encounter.  EXAM: CT MAXILLOFACIAL WITH CONTRAST  TECHNIQUE: Multidetector CT imaging of the maxillofacial structures was performed with intravenous  contrast. Multiplanar CT image reconstructions were also generated. A small metallic BB was placed on the right temple in order to reliably differentiate right from left.  CONTRAST:  75mL OMNIPAQUE IOHEXOL 300 MG/ML  SOLN  COMPARISON:  CT of the head performed 10/24/2012  FINDINGS: There is no evidence of fracture or dislocation. The maxilla and mandible appear intact. The nasal bone is unremarkable in appearance. The visualized dentition demonstrates no acute abnormality.  The orbits are intact bilaterally. Mucosal thickening is noted at the right maxillary sinus. The patient is status post right-sided maxillary antrectomy. The remaining visualized paranasal sinuses and mastoid air cells are well-aerated.  Soft tissue swelling is noted along the left side of the face, tracking from the left eyelid inferiorly to the level of the mandible, including the left upper lip. There is minimal associated skin thickening. Inflammation is slightly more focal at the level of the inferior aspect of the left maxillary sinus, and tracking inferiorly along the platysma, but there is no evidence of abscess. No foreign bodies are identified.  The parapharyngeal fat planes are preserved. The nasopharynx, oropharynx and hypopharynx are unremarkable in appearance. The visualized portions of the valleculae are grossly unremarkable.  The parotid and submandibular glands are within normal limits. No cervical lymphadenopathy is seen.  IMPRESSION: 1. Soft tissue swelling along the left side of the face, tracking from the left eyelid inferiorly to the level of the mandible, including the left upper lip. Minimal associated skin thickening. Inflammation is slightly more focal at the level of the inferior aspect of the left maxillary sinus, and tracking inferiorly along the platysma, but there is no evidence of abscess at this time. No foreign bodies seen. 2. No evidence of fracture or dislocation with regard to the maxillofacial structures. 3.  Mucosal thickening at the right maxillary sinus.   Electronically Signed   By: Roanna Raider M.D.   On: 09/04/2014 21:36    Scheduled Meds: . docusate sodium  100 mg Oral BID  . pantoprazole  40 mg Oral Daily  . piperacillin-tazobactam  3.375 g Intravenous Once  . sodium chloride  3 mL Intravenous Q12H  . vancomycin  1,000 mg Intravenous Once    Assessment/Plan:  1. Left facial cellulitis, leukocytosis. With facial cellulitis I usually like to prescribe IV vancomycin and with the cellulitis protocol IV Zosyn also prescribed. Continue to monitor daily. Warm soaks. I advised her that black spot on her left face may need to be evaluated by a dermatologist after the cellulitis has improved. 2. Gastroesophageal reflux disease without esophagitis continue Protonix. 3. History of migraine.  Code Status:     Code Status Orders        Start     Ordered   09/05/14 0237  Full code  Continuous     09/05/14 0236     Disposition Plan: Home once better  Time spent: 35 minutes  [name]  [facility]

## 2014-09-05 NOTE — H&P (Signed)
[facility] - [place] at [facility]   PATIENT NAME: Colleen Delacruz    MR#:  [id number]  DATE OF BIRTH:  Aug 08, 1960  DATE OF ADMISSION:  09/04/2014  PRIMARY CARE PHYSICIAN: No Pcp   REQUESTING/REFERRING PHYSICIAN: Dr. Cyril Loosen  CHIEF COMPLAINT:  Lt facial swelling  HISTORY OF PRESENT ILLNESS:  Colleen Delacruz  is a 54 y.o. female with  past medical history of migraines, depression and anxiety is presenting to the ED with a chief complaint of her worsening of left facial swelling associated with pain. Proximal mid to 2 days ago patient felt a small bump on the left side of her cheek, following that she has noticed some redness and swelling. Yesterday she was feeling pressure in that area so she stuck a pin into this area and developed increased pain and swelling following that. CT has revealed soft tissue swelling  PAST MEDICAL HISTORY:   Past Medical History  Diagnosis Date  . Migraines   . Depression   . Anxiety     PAST SURGICAL HISTOIRY:   Past Surgical History  Procedure Laterality Date  . Tubal ligation      SOCIAL HISTORY:   History  Substance Use Topics  . Smoking status: Never Smoker   . Smokeless tobacco: Never Used  . Alcohol Use: No   lives with husband  FAMILY HISTORY:   Cardiac history runs in her family DRUG ALLERGIES:  No Known Allergies  REVIEW OF SYSTEMS:  CONSTITUTIONAL: No fever, fatigue or weakness.  EYES: No blurred or double vision.  EARS, NOSE, AND THROAT: No tinnitus or ear pain. Left facial pain with redness RESPIRATORY: No cough, shortness of breath, wheezing or hemoptysis.  CARDIOVASCULAR: No chest pain, orthopnea, edema.  GASTROINTESTINAL: No nausea, vomiting, diarrhea or abdominal pain.  GENITOURINARY: No dysuria, hematuria.  ENDOCRINE: No polyuria, nocturia,  HEMATOLOGY: No anemia, easy bruising or bleeding SKIN: No rash or lesion. MUSCULOSKELETAL: No joint pain or arthritis.   NEUROLOGIC: No tingling,  numbness, weakness.  PSYCHIATRY: No anxiety or depression.   MEDICATIONS AT HOME:   Prior to Admission medications   Medication Sig Start Date End Date Taking? Authorizing Provider  ibuprofen (ADVIL,MOTRIN) 200 MG tablet Take 2 tablets (400 mg total) by mouth every 6 (six) hours as needed. For migraine headaches 01/14/12  Yes Sanjuana Kava, NP  omeprazole (PRILOSEC) 40 MG capsule Take 40 mg by mouth daily.   Yes Historical Provider, MD      VITAL SIGNS:  Blood pressure 134/62, pulse 59, temperature 98.6 F (37 C), temperature source Oral, resp. rate 18, height 5\' 8"  (1.727 m), weight 90.719 kg (200 lb), SpO2 100 %.  PHYSICAL EXAMINATION:  GENERAL:  54 y.o.-year-old patient lying in the bed with no acute distress.  EYES: Pupils equal, round, reactive to light and accommodation. No scleral icterus. Extraocular muscles intact.  HEENT: Head atraumatic, normocephalic. Oropharynx and nasopharynx clear. Left side of the face is erythematous, tender and edematous with small pimple in the middle of the cheek. Swollen left lower eyelid NECK:  Supple, no jugular venous distention. No thyroid enlargement, no tenderness.  LUNGS: Normal breath sounds bilaterally, no wheezing, rales,rhonchi or crepitation. No use of accessory muscles of respiration.  CARDIOVASCULAR: S1, S2 normal. No murmurs, rubs, or gallops.  ABDOMEN: Soft, nontender, nondistended. Bowel sounds present. No organomegaly or mass.  EXTREMITIES: No pedal edema, cyanosis, or clubbing.  NEUROLOGIC: Cranial nerves II through XII are intact. Muscle strength 5/5 in all extremities. Sensation intact. Gait  not checked.  PSYCHIATRIC: The patient is alert and oriented x 3.  SKIN: No obvious rash, lesion, or ulcer.   LABORATORY PANEL:   CBC  Recent Labs Lab 09/04/14 2023  WBC 13.3*  HGB 13.0  HCT 39.1  PLT 274    ------------------------------------------------------------------------------------------------------------------  Chemistries   Recent Labs Lab 09/04/14 2023  NA 135  K 3.0*  CL 102  CO2 23  GLUCOSE 117*  BUN 10  CREATININE 0.99  CALCIUM 8.5*   ------------------------------------------------------------------------------------------------------------------  Cardiac Enzymes No results for input(s): TROPONINI in the last 168 hours. ------------------------------------------------------------------------------------------------------------------  RADIOLOGY:  Ct Maxillofacial W/cm  09/04/2014   CLINICAL DATA:  Acute onset of left-sided facial swelling, after probing focal skin discoloration with needle. Initial encounter.  EXAM: CT MAXILLOFACIAL WITH CONTRAST  TECHNIQUE: Multidetector CT imaging of the maxillofacial structures was performed with intravenous contrast. Multiplanar CT image reconstructions were also generated. A small metallic BB was placed on the right temple in order to reliably differentiate right from left.  CONTRAST:  75mL OMNIPAQUE IOHEXOL 300 MG/ML  SOLN  COMPARISON:  CT of the head performed 10/24/2012  FINDINGS: There is no evidence of fracture or dislocation. The maxilla and mandible appear intact. The nasal bone is unremarkable in appearance. The visualized dentition demonstrates no acute abnormality.  The orbits are intact bilaterally. Mucosal thickening is noted at the right maxillary sinus. The patient is status post right-sided maxillary antrectomy. The remaining visualized paranasal sinuses and mastoid air cells are well-aerated.  Soft tissue swelling is noted along the left side of the face, tracking from the left eyelid inferiorly to the level of the mandible, including the left upper lip. There is minimal associated skin thickening. Inflammation is slightly more focal at the level of the inferior aspect of the left maxillary sinus, and tracking inferiorly  along the platysma, but there is no evidence of abscess. No foreign bodies are identified.  The parapharyngeal fat planes are preserved. The nasopharynx, oropharynx and hypopharynx are unremarkable in appearance. The visualized portions of the valleculae are grossly unremarkable.  The parotid and submandibular glands are within normal limits. No cervical lymphadenopathy is seen.  IMPRESSION: 1. Soft tissue swelling along the left side of the face, tracking from the left eyelid inferiorly to the level of the mandible, including the left upper lip. Minimal associated skin thickening. Inflammation is slightly more focal at the level of the inferior aspect of the left maxillary sinus, and tracking inferiorly along the platysma, but there is no evidence of abscess at this time. No foreign bodies seen. 2. No evidence of fracture or dislocation with regard to the maxillofacial structures. 3. Mucosal thickening at the right maxillary sinus.   Electronically Signed   By: Roanna Raider M.D.   On: 09/04/2014 21:36    EKG:   Orders placed or performed in visit on 04/06/11  . EKG 12-Lead    IMPRESSION AND PLAN:   Colleen Delacruz  is a 54 y.o. female with  past medical history of migraines, depression and anxiety is presenting to the ED with a chief complaint of her worsening of left facial swelling associated with pain. Proximal mid to 2 days ago patient felt a small bump on the left side of her cheek, following that she has noticed some redness and swelling.  1. Acute left facial cellulitis with leukocytosis   Provided clindamycin IV Tdap  booster as she punctured the wound with the pain Follow-up blood cultures Check a.m. Labs Pain management as needed  2. History of migraine headaches Provided Fioricet as needed basis  3 history of anxiety and depression Currently not actively depressed and not feeling anxious Not on any medications at home  4. Obesity Lifestyle changes were advised  DVT  prophylaxis-not needed, as patient is ambulatory, not at high risk     All the records are reviewed and case discussed with ED provider. Management plans discussed with the patient, family and they are in agreement.  CODE STATUS: Full code, husband is Film/video editor of attorney  TOTAL TIME TAKING CARE OF THIS PATIENT, reviewing the medical records, history and physical, admission orders and [place] of care -45 minutes.    Ramonita Lab M.D on 09/05/2014 at 1:16 AM  Between 7am to 6pm - Pager - [phone]  After 6pm go to [email or web address] - password [email or web address] [facility]  [facility]  Office  [phone]  CC: Primary care physician; No Pcp

## 2014-09-06 NOTE — Consult Note (Signed)
Colleen Delacruz, Horseman [id number] 1960-05-14 [name], MD  Reason for Consult: Facial cellulitis to assess for possibility of draining  HPI: Patient is a relatively healthy 54 year old white female who hadn't seen a doctor in the last couple years. She has had little black spot on her left cheek for approximately 20 years. She had tried to see if she could get it to go away in the early years but could not get it to change. She has left it alone now for many years and has not given her any problems. She noted several days ago starting to swell little bed and had a lump under it. She put a needle in it to try to see if it would drain. A small amount of blood came out but no pus. By the next day her cheek and swollen dramatically and up into her nose and forehead. She presented to the emergency room and was admitted and started on IV antibody. She has now completed 48 hours of IV antibiotics and has had significant improvement in the swelling. Assessment was made to see if she is a candidate for any incision and drainage  Allergies: No Known Allergies  ROS: Review of systems normal other than 12 systems except per HPI.  PMH:  Past Medical History  Diagnosis Date  . Migraines   . Depression   . Anxiety     FH: History reviewed. No pertinent family history.  SH:  History   Social History  . Marital Status: Married    Spouse Name: N/A  . Number of Children: N/A  . Years of Education: N/A   Occupational History  . Not on file.   Social History Main Topics  . Smoking status: Never Smoker   . Smokeless tobacco: Never Used  . Alcohol Use: No  . Drug Use: No  . Sexual Activity: Yes   Other Topics Concern  . Not on file   Social History Narrative    PSH:  Past Surgical History  Procedure Laterality Date  . Tubal ligation      Physical  Exam: Well-developed well-nourished weight female CN 2-12 grossly intact and symmetric.  Skin warm and dry. Has read swollen area that is  firm in the left mid cheek, and has a small scab in the middle of it but is not pointing. No fluctuance or evidence of abscess currently. Rest of cellulitis around forehead and nose has resolved. Nasal cavity without polyps or purulence. External nose and ears without masses or lesions. EOMI.  Neck supple with no masses or lesions. Cervical lymphadenopathy palpated. Thyroid normal with no masses.   A/P: Cellulitis left cheek secondary to staph infection. Likely has had a small cyst underneath this long-term. She likely will need another 24 to possibly 48 hours of IV antibodies to make sure its really settled down before switching to oral antibodies and sending her home. She can be evaluated after she completes her antibody course to see if there is any residual cyst left that may need excision as an office procedure. She would be benefited by social services consult to evaluate for possibly getting back on Medicaid. Call me if this should worsen.   Karey Suthers H 09/06/2014 6:26 PM

## 2014-09-06 NOTE — Plan of Care (Signed)
Problem: Discharge Progression Outcomes Goal: Other Discharge Outcomes/Goals Outcome: Progressing Plan of care progress to goal: patient afebrile, remains on iv antibiotics, (vancomycin 1gr, and zoysn) for cellulits, swelling decreased to left side of face, no drainage noted. Morphine administered as ordered for pain prn.

## 2014-09-06 NOTE — Progress Notes (Signed)
Patient ID: Colleen Delacruz, female   DOB: 1960-04-30, 54 y.o.   MRN: [id number] [facility] PROGRESS NOTE  HPI/Subjective: The patient had a black spot on her left cheek for about 19 years. Patient still having a lot of pain and pressure in her left cheek. She states some swelling is coming down from the left eye area and throat area but still swollen and that she can lots of pressure.  Objective: Filed Vitals:   09/06/14 0441  BP: 107/39  Pulse: 65  Temp:   Resp:     Filed Weights   09/04/14 1752  Weight: 90.719 kg (200 lb)    ROS: Review of Systems  Constitutional: Negative for fever and chills.  Eyes: Negative for blurred vision.  Respiratory: Negative for cough and shortness of breath.   Cardiovascular: Negative for chest pain.  Gastrointestinal: Positive for constipation. Negative for nausea, vomiting, abdominal pain and diarrhea.  Genitourinary: Negative for dysuria.  Musculoskeletal: Negative for joint pain.  Neurological: Negative for dizziness and headaches.   Exam: Physical Exam  Constitutional: She is oriented to person, place, and time.  HENT:  Nose: No mucosal edema.  Mouth/Throat: No oropharyngeal exudate or posterior oropharyngeal edema.  Eyes: Conjunctivae, EOM and lids are normal. Pupils are equal, round, and reactive to light.  Neck: No JVD present. Carotid bruit is not present. No edema present. No thyroid mass and no thyromegaly present.  Cardiovascular: S1 normal and S2 normal.  Exam reveals no gallop.   No murmur heard. Pulses:      Dorsalis pedis pulses are 2+ on the right side, and 2+ on the left side.  Respiratory: No respiratory distress. She has no wheezes. She has no rhonchi. She has no rales.  GI: Soft. Bowel sounds are normal. There is no tenderness.  Musculoskeletal:       Right ankle: She exhibits no swelling.       Left ankle: She exhibits no swelling.  Lymphadenopathy:    She has cervical adenopathy.       Left  cervical: Superficial cervical and posterior cervical adenopathy present.  Neurological: She is alert and oriented to person, place, and time. No cranial nerve deficit.  Skin: Skin is warm. No rash noted. Nails show no clubbing.  Left facial swelling and erythema. Pain to palpation. Fullness in left cheek.  Psychiatric: She has a normal mood and affect.    Data Reviewed: Basic Metabolic Panel:  Recent Labs Lab 09/04/14 2023 09/05/14 0506  NA 135 141  K 3.0* 3.7  CL 102 110  CO2 23 26  GLUCOSE 117* 102*  BUN 10 9  CREATININE 0.99 0.84  CALCIUM 8.5* 8.4*   Liver Function Tests:  Recent Labs Lab 09/05/14 0506  AST 17  ALT 10*  ALKPHOS 78  BILITOT 0.3  PROT 6.4*  ALBUMIN 3.6   CBC:  Recent Labs Lab 09/04/14 2023 09/05/14 0506  WBC 13.3* 10.3  NEUTROABS 10.6*  --   HGB 13.0 12.6  HCT 39.1 37.2  MCV 85.3 85.2  PLT 274 256    Recent Results (from the past 240 hour(s))  Blood culture (routine x 2)     Status: None (Preliminary result)   Collection Time: 09/04/14  8:23 PM  Result Value Ref Range Status   Specimen Description BLOOD  Final   Special Requests BOTTLES DRAWN AEROBIC AND ANAEROBIC 5CC  Final   Culture NO GROWTH 2 DAYS  Final   Report Status PENDING  Incomplete  Blood  culture (routine x 2)     Status: None (Preliminary result)   Collection Time: 09/04/14  8:23 PM  Result Value Ref Range Status   Specimen Description BLOOD  Final   Special Requests BOTTLES DRAWN AEROBIC AND ANAEROBIC 5CC  Final   Culture NO GROWTH 2 DAYS  Final   Report Status PENDING  Incomplete     Studies: Ct Maxillofacial W/cm  09/04/2014   CLINICAL DATA:  Acute onset of left-sided facial swelling, after probing focal skin discoloration with needle. Initial encounter.  EXAM: CT MAXILLOFACIAL WITH CONTRAST  TECHNIQUE: Multidetector CT imaging of the maxillofacial structures was performed with intravenous contrast. Multiplanar CT image reconstructions were also generated. A small  metallic BB was placed on the right temple in order to reliably differentiate right from left.  CONTRAST:  75mL OMNIPAQUE IOHEXOL 300 MG/ML  SOLN  COMPARISON:  CT of the head performed 10/24/2012  FINDINGS: There is no evidence of fracture or dislocation. The maxilla and mandible appear intact. The nasal bone is unremarkable in appearance. The visualized dentition demonstrates no acute abnormality.  The orbits are intact bilaterally. Mucosal thickening is noted at the right maxillary sinus. The patient is status post right-sided maxillary antrectomy. The remaining visualized paranasal sinuses and mastoid air cells are well-aerated.  Soft tissue swelling is noted along the left side of the face, tracking from the left eyelid inferiorly to the level of the mandible, including the left upper lip. There is minimal associated skin thickening. Inflammation is slightly more focal at the level of the inferior aspect of the left maxillary sinus, and tracking inferiorly along the platysma, but there is no evidence of abscess. No foreign bodies are identified.  The parapharyngeal fat planes are preserved. The nasopharynx, oropharynx and hypopharynx are unremarkable in appearance. The visualized portions of the valleculae are grossly unremarkable.  The parotid and submandibular glands are within normal limits. No cervical lymphadenopathy is seen.  IMPRESSION: 1. Soft tissue swelling along the left side of the face, tracking from the left eyelid inferiorly to the level of the mandible, including the left upper lip. Minimal associated skin thickening. Inflammation is slightly more focal at the level of the inferior aspect of the left maxillary sinus, and tracking inferiorly along the platysma, but there is no evidence of abscess at this time. No foreign bodies seen. 2. No evidence of fracture or dislocation with regard to the maxillofacial structures. 3. Mucosal thickening at the right maxillary sinus.   Electronically Signed    By: Roanna Raider M.D.   On: 09/04/2014 21:36    Scheduled Meds: . docusate sodium  100 mg Oral BID  . pantoprazole  40 mg Oral Daily  . piperacillin-tazobactam (ZOSYN)  IV  3.375 g Intravenous 3 times per day  . sodium chloride  3 mL Intravenous Q12H  . vancomycin  1,000 mg Intravenous Q12H    Assessment/Plan:  1. Left facial cellulitis, leukocytosis. Patient then developed a larger area of fullness. I will consult ENT to evaluate to see if there is anything that they can drain. The initial CAT scan when she came in was negative for abscess. Continue IV vancomycin and Zosyn at this time. 2. Gastroesophageal reflux disease without esophagitis continue Protonix. 3. History of migraine.  Code Status:     Code Status Orders        Start     Ordered   09/05/14 0237  Full code   Continuous     09/05/14 0236  Disposition Plan: Home once better  Time spent: 25 minutes  [name]  [facility]

## 2014-09-06 NOTE — Consult Note (Signed)
WOC wound consult note Reason for Consult: Cellulitis to left face, hx of MRSA, per patient Wound type:infectious Pressure Ulcer POA: N/A Measurement:0.5 cm lesion in diameter with 2 cm diameter erythema surrounding.  Edema and induration noted.  Wound RUE:AVWU, scabbed wound bed Drainage (amount, consistency, odor) None noted Periwound:Erythema, edema and induration Dressing procedure/placement/frequency: Keep facial lesion clean and dry.  No topical recommendations at this time.  Will not follow at this time.  Please re-consult if needed.  Maple Hudson RN BSN CWON Pager [phone]

## 2014-09-06 NOTE — Plan of Care (Signed)
Problem: Discharge Progression Outcomes Goal: Other Discharge Outcomes/Goals Plan of care progress to goal:  Patient remains afebrile. IV antibiotics infusing as ordered (Vanc. Q12h/Zosyn TID). No drainage from left side of face noted. Morphine given for pain Q4h - minimal relief noted. Ibuprofen given once. Wound consult completed. ENT consult placed. Up to bathroom independently. No complaints of nausea/vomiting.

## 2014-09-06 NOTE — Progress Notes (Signed)
Patient alert and oriented, states the swelling to left side of face has went down, she remains on IV abts, zoysn and vancomycin. Patient has been medicated for pain x one, Patient is afebrile.

## 2014-09-07 LAB — VANCOMYCIN, TROUGH: Vancomycin Tr: 11 ug/mL (ref 10–20)

## 2014-09-07 MED ORDER — MORPHINE SULFATE 2 MG/ML IJ SOLN
2.0000 mg | INTRAMUSCULAR | Status: DC | PRN
  Administered 2014-09-07 – 2014-09-09 (×9): 2 mg via INTRAVENOUS
  Filled 2014-09-07 (×9): qty 1

## 2014-09-07 MED ORDER — FLUCONAZOLE 100 MG PO TABS
200.0000 mg | ORAL_TABLET | Freq: Once | ORAL | Status: AC
  Administered 2014-09-07: 12:00:00 200 mg via ORAL
  Filled 2014-09-07: qty 1

## 2014-09-07 MED ORDER — OXYCODONE HCL 5 MG PO TABS
10.0000 mg | ORAL_TABLET | ORAL | Status: DC | PRN
  Administered 2014-09-07 – 2014-09-09 (×10): 10 mg via ORAL
  Filled 2014-09-07 (×10): qty 2

## 2014-09-07 NOTE — Care Management (Signed)
Admitted to [facility] with the diagnosis of cellulitis of the face. Lives alone. Daughter is Danelle [phone]). States she hasn't seen a primary care physician in 2 years. 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 "Doesn't know who she will follow-up with."  Care Everywhere documents a Dr. Mayford Knife in [place]. Last seen January 2016? Gwenette Greet RN MSN Care Management [phone]

## 2014-09-07 NOTE — Plan of Care (Signed)
Problem: Discharge Progression Outcomes Goal: Other Discharge Outcomes/Goals Outcome: Progressing Plan of care progress to goal: Pain - pt complains of pain in her face which is relieved temporarily with Morphine. Activity - pt up ad lib

## 2014-09-07 NOTE — Progress Notes (Signed)
Patient ID: PERNELLA ACKERLEY, female   DOB: Mar 02, 1960, 54 y.o.   MRN: [id number] [facility] Physicians PROGRESS NOTE  HPI/Subjective: The patient is having severe pressure in the left cheek area. The morphine is not helping her. It is now starting to come to a head. But has not drained yet. Patient complains of a vaginal yeast infection.  Objective: Filed Vitals:   09/07/14 1012  BP: 136/73  Pulse: 70  Temp: 98.9 F (37.2 C)  Resp:     Filed Weights   09/04/14 1752  Weight: 90.719 kg (200 lb)    ROS: Review of Systems  Constitutional: Negative for fever and chills.  Eyes: Negative for blurred vision.  Respiratory: Negative for cough and shortness of breath.   Cardiovascular: Negative for chest pain.  Gastrointestinal: Negative for nausea, vomiting, abdominal pain, diarrhea and constipation.  Genitourinary: Negative for dysuria.  Musculoskeletal: Negative for joint pain.  Neurological: Negative for dizziness and headaches.   Exam: Physical Exam  Constitutional: She is oriented to person, place, and time.  HENT:  Nose: No mucosal edema.  Mouth/Throat: No oropharyngeal exudate or posterior oropharyngeal edema.  Eyes: Conjunctivae, EOM and lids are normal. Pupils are equal, round, and reactive to light.  Neck: No JVD present. Carotid bruit is not present. No edema present. No thyroid mass and no thyromegaly present.  Cardiovascular: S1 normal and S2 normal.  Exam reveals no gallop.   No murmur heard. Pulses:      Dorsalis pedis pulses are 2+ on the right side, and 2+ on the left side.  Respiratory: No respiratory distress. She has no wheezes. She has no rhonchi. She has no rales.  GI: Soft. Bowel sounds are normal. There is no tenderness.  Musculoskeletal:       Right ankle: She exhibits no swelling.       Left ankle: She exhibits no swelling.  Lymphadenopathy:    She has cervical adenopathy.       Left cervical: Superficial cervical and posterior cervical  adenopathy present.  Neurological: She is alert and oriented to person, place, and time. No cranial nerve deficit.  Skin: Skin is warm. No rash noted. Nails show no clubbing.  Left facial swelling and erythema. Pain to palpation. Fullness in left cheek. In the center of the erythema is starting to come to a head. I tried squeezing it but the patient was in too much discomfort.  Psychiatric: She has a normal mood and affect.    Data Reviewed: Basic Metabolic Panel:  Recent Labs Lab 09/04/14 2023 09/05/14 0506  NA 135 141  K 3.0* 3.7  CL 102 110  CO2 23 26  GLUCOSE 117* 102*  BUN 10 9  CREATININE 0.99 0.84  CALCIUM 8.5* 8.4*   Liver Function Tests:  Recent Labs Lab 09/05/14 0506  AST 17  ALT 10*  ALKPHOS 78  BILITOT 0.3  PROT 6.4*  ALBUMIN 3.6   CBC:  Recent Labs Lab 09/04/14 2023 09/05/14 0506  WBC 13.3* 10.3  NEUTROABS 10.6*  --   HGB 13.0 12.6  HCT 39.1 37.2  MCV 85.3 85.2  PLT 274 256     Scheduled Meds: . docusate sodium  100 mg Oral BID  . fluconazole  200 mg Oral Once  . pantoprazole  40 mg Oral Daily  . piperacillin-tazobactam (ZOSYN)  IV  3.375 g Intravenous 3 times per day  . sodium chloride  3 mL Intravenous Q12H  . vancomycin  1,000 mg Intravenous Q12H  Assessment/Plan:  1. Left facial cellulitis, leukocytosis. Patient then developed a larger area of fullness which is now coming to ahead. Hopefully this will start to drain on its own. I will give warm compresses. Continue IV vancomycin and Zosyn at this time. Severe pain left cheek IV morphine not helping will order by mouth oxycodone for breakthrough pain also. 2. Gastroesophageal reflux disease without esophagitis continue Protonix. 3. Vaginal yeast infection we'll give Diflucan 200 mg by mouth 1 today. May end up needing repeat dosing while on antibiotics. 4. History of migraine.  Code Status:     Code Status Orders        Start     Ordered   09/05/14 0237  Full code    Continuous     09/05/14 0236     Disposition Plan: Home once better  Time spent: 20 minutes  [name]  [facility]

## 2014-09-07 NOTE — Progress Notes (Signed)
   09/07/14 1320  Clinical Encounter Type  Visited With Patient  Visit Type Initial  Spiritual Encounters  Spiritual Needs Emotional  Stress Factors  Patient Stress Factors Financial concerns  Family Stress Factors Financial concerns  Provided pastoral support and presence to patient.  Patient is concerned about how she will pay for hospital visit as she has no insurance.  She did say that she is doing better than before and hopes to go home soon.  [employer] Jaylia Pettus-pager [phone]

## 2014-09-07 NOTE — Plan of Care (Signed)
Problem: Discharge Progression Outcomes Goal: Other Discharge Outcomes/Goals Outcome: Progressing Plan of care progress to goal for: 1. Pain-pt c/o pain, prn meds given with improvement 2. Hemodynamically-             -VSS, pt remains afebrile this shift             -IV ABt continues per order 3. Complications-no evidence of this shift 4. Diet-pt tolerating diet this shift 5. Activity-pt up to the BR with unassisted

## 2014-09-07 NOTE — Progress Notes (Signed)
ANTIBIOTIC CONSULT NOTE - FOLLOW UP  Pharmacy Consult for Vancomycin and Zosyn Indication: Cellulitis  No Known Allergies  Patient Measurements: Height:  (172.7 cm) Weight: 200 lb (90.719 kg) IBW/kg (Calculated) : 63.9 Adjusted Body Weight: 74.62  Vital Signs: Temp: 98.9 F (37.2 C) (07/26 1012) Temp Source: Oral (07/26 1012) BP: 136/73 mmHg (07/26 1012) Pulse Rate: 70 (07/26 1012)  Labs:  Recent Labs  09/04/14 2023 09/05/14 0506  WBC 13.3* 10.3  HGB 13.0 12.6  PLT 274 256  CREATININE 0.99 0.84   Estimated Creatinine Clearance: 91.2 mL/min (by C-G formula based on Cr of 0.84).  Recent Labs  09/07/14 1040  VANCOTROUGH 11     Microbiology: Recent Results (from the past 720 hour(s))  Blood culture (routine x 2)     Status: None (Preliminary result)   Collection Time: 09/04/14  8:23 PM  Result Value Ref Range Status   Specimen Description BLOOD  Final   Special Requests BOTTLES DRAWN AEROBIC AND ANAEROBIC 5CC  Final   Culture NO GROWTH 3 DAYS  Final   Report Status PENDING  Incomplete  Blood culture (routine x 2)     Status: None (Preliminary result)   Collection Time: 09/04/14  8:23 PM  Result Value Ref Range Status   Specimen Description BLOOD  Final   Special Requests BOTTLES DRAWN AEROBIC AND ANAEROBIC 5CC  Final   Culture NO GROWTH 3 DAYS  Final   Report Status PENDING  Incomplete    Medical History: Past Medical History  Diagnosis Date  . Migraines   . Depression   . Anxiety     Goal of Therapy:  Vancomycin trough level 10-15 mcg/ml  Plan:  Zosyn:  CrCl >> 20 - Ordered Zosyn 3.375 gm EI q8hr.  First dose hung at 13:27 today.  Vancomycin:  CrCl~ 78.13, ke = 0.069, T 1/2 = 10hr, VD = 52.2.  Ordered Vancomycin 1000 mg IV once.  Will order Vancomycin 1000 mg IV q12hr  starting 6 hours after first dose (stacked dose) has been hung and first trough prior to 4th dose.  Vancomycin trough level resulted @ 11 mcg/ml. Will continue current regimen  of vancomycin 1 g iv q12 hours.   Cylis Ayars D 09/07/2014,1:45 PM

## 2014-09-08 LAB — BASIC METABOLIC PANEL
ANION GAP: 10 (ref 5–15)
BUN: 8 mg/dL (ref 6–20)
CO2: 27 mmol/L (ref 22–32)
Calcium: 8.8 mg/dL — ABNORMAL LOW (ref 8.9–10.3)
Chloride: 103 mmol/L (ref 101–111)
Creatinine, Ser: 0.9 mg/dL (ref 0.44–1.00)
GFR calc non Af Amer: >60 mL/min (ref >60)
Glucose, Bld: 102 mg/dL — ABNORMAL HIGH (ref 65–99)
Potassium: 3.3 mmol/L — ABNORMAL LOW (ref 3.5–5.1)
SODIUM: 140 mmol/L (ref 135–145)

## 2014-09-08 LAB — CBC
HEMATOCRIT: 38.4 % (ref 35.0–47.0)
Hemoglobin: 12.7 g/dL (ref 12.0–16.0)
MCH: 28.7 pg (ref 26.0–34.0)
MCHC: 33 g/dL (ref 32.0–36.0)
MCV: 86.8 fL (ref 80.0–100.0)
Platelets: 264 10*3/uL (ref 150–440)
RBC: 4.42 MIL/uL (ref 3.80–5.20)
RDW: 13.6 % (ref 11.5–14.5)
WBC: 8.6 10*3/uL (ref 3.6–11.0)

## 2014-09-08 MED ORDER — FLUCONAZOLE 100 MG PO TABS
100.0000 mg | ORAL_TABLET | Freq: Every day | ORAL | Status: DC
  Administered 2014-09-08 – 2014-09-09 (×2): 100 mg via ORAL
  Filled 2014-09-08 (×2): qty 1

## 2014-09-08 MED ORDER — POTASSIUM CHLORIDE CRYS ER 20 MEQ PO TBCR
40.0000 meq | EXTENDED_RELEASE_TABLET | Freq: Once | ORAL | Status: AC
  Administered 2014-09-08: 40 meq via ORAL
  Filled 2014-09-08: qty 2

## 2014-09-08 NOTE — Progress Notes (Signed)
Patient ID: Colleen Delacruz, female   DOB: Mar 15, 1960, 54 y.o.   MRN: [id number]  [facility] Physicians PROGRESS NOTE  HPI/Subjective: Patient is upset this morning then it's not moving in the right the correction quick enough. The cellulitis now starting to have a drainage tract. The patient states that some pus some clear stuff and a blood has been able to come out.  Objective: Filed Vitals:   09/08/14 0513  BP: 127/51  Pulse: 62  Temp: 97.8 F (36.6 C)  Resp: 20    Filed Weights   09/04/14 1752  Weight: 90.719 kg (200 lb)    ROS: Review of Systems  Constitutional: Negative for fever and chills.  Eyes: Negative for blurred vision.  Respiratory: Negative for cough and shortness of breath.   Cardiovascular: Negative for chest pain.  Gastrointestinal: Negative for nausea, vomiting, abdominal pain, diarrhea and constipation.  Genitourinary: Negative for dysuria.  Musculoskeletal: Negative for joint pain.  Neurological: Negative for dizziness and headaches.   Exam: Physical Exam  Constitutional: She is oriented to person, place, and time.  HENT:  Nose: No mucosal edema.  Mouth/Throat: No oropharyngeal exudate or posterior oropharyngeal edema.  Eyes: Conjunctivae, EOM and lids are normal. Pupils are equal, round, and reactive to light.  Neck: No JVD present. Carotid bruit is not present. No edema present. No thyroid mass and no thyromegaly present.  Cardiovascular: S1 normal and S2 normal.  Exam reveals no gallop.   No murmur heard. Pulses:      Dorsalis pedis pulses are 2+ on the right side, and 2+ on the left side.  Respiratory: No respiratory distress. She has no wheezes. She has no rhonchi. She has no rales.  GI: Soft. Bowel sounds are normal. There is no tenderness.  Musculoskeletal:       Right ankle: She exhibits no swelling.       Left ankle: She exhibits no swelling.  Lymphadenopathy:    She has cervical adenopathy.       Left cervical: Superficial  cervical and posterior cervical adenopathy present.  Neurological: She is alert and oriented to person, place, and time. No cranial nerve deficit.  Skin: Skin is warm. No rash noted. Nails show no clubbing.  Left facial swelling and erythema. Pain to palpation. Fullness in left cheek. Starting to develop a drainage tract. When I squeeze the patient did have a lot of pain and some serous fluid didn't come out.  Psychiatric: She has a normal mood and affect.    Data Reviewed: Basic Metabolic Panel:  Recent Labs Lab 09/04/14 2023 09/05/14 0506 09/08/14 0440  NA 135 141 140  K 3.0* 3.7 3.3*  CL 102 110 103  CO2 GLUCOSE 117* 102* 102*  BUN CREATININE 0.99 0.84 0.90  CALCIUM 8.5* 8.4* 8.8*   Liver Function Tests:  Recent Labs Lab 09/05/14 0506  AST 17  ALT 10*  ALKPHOS 78  BILITOT 0.3  PROT 6.4*  ALBUMIN 3.6   CBC:  Recent Labs Lab 09/04/14 2023 09/05/14 0506 09/08/14 0440  WBC 13.3* 10.3 8.6  NEUTROABS 10.6*  --   --   HGB 13.0 12.6 12.7  HCT 39.1 37.2 38.4  MCV 85.3 85.2 86.8  PLT 274 256 264     Scheduled Meds: . docusate sodium  100 mg Oral BID  . pantoprazole  40 mg Oral Daily  . piperacillin-tazobactam (ZOSYN)  IV  3.375 g Intravenous 3 times per day  .  potassium chloride  40 mEq Oral Once  . sodium chloride  3 mL Intravenous Q12H  . vancomycin  1,000 mg Intravenous Q12H    Assessment/Plan:  1. Left facial cellulitis, leukocytosis. Patient started to develop drainage tract. I will give warm compresses. Continue IV vancomycin and Zosyn at this time. Severe pain left cheek IV morphine not helping will order by mouth oxycodone for breakthrough pain also. 2. Gastroesophageal reflux disease without esophagitis continue Protonix. 3. Vaginal yeast infection we'll give Diflucan 100 mg daily while on antibiotics. 4. History of migraine.  Code Status:     Code Status Orders        Start     Ordered   09/05/14 0237  Full code    Continuous     09/05/14 0236     Disposition Plan: Home hopefully within the next day or 2.  Time spent: 20 minutes  [name]  [facility] Hospital[facility]

## 2014-09-08 NOTE — Plan of Care (Signed)
Problem: Discharge Progression Outcomes Goal: Other Discharge Outcomes/Goals Outcome: Progressing Plan of care progress to goal: Pain - pt has constant pain in left cheek Hemodynamically stable - vital signs stable this shift Diet - tolerating Activity - pt up ad lib Wound improving - pt using warm compresses, swelling has decreased

## 2014-09-08 NOTE — Plan of Care (Addendum)
Problem: Discharge Progression Outcomes Goal: Other Discharge Outcomes/Goals Outcome: Progressing Plan of care progress to goal for: 1. Pain-pt c/o pain, prn meds given with improvement 2. Hemodynamically-             -VSS, pt remains afebrile this shift             -IV ABt continues per order 3. Complications-no evidence of this shift 4. Diet-pt tolerating diet this shift 5. Activity-pt up to the BR with unassisted     

## 2014-09-09 LAB — CULTURE, BLOOD (ROUTINE X 2)
Culture: NO GROWTH
Culture: NO GROWTH

## 2014-09-09 MED ORDER — CLINDAMYCIN HCL 300 MG PO CAPS: 300.0000 mg | ORAL_CAPSULE | Freq: Three times a day (TID) | ORAL | Status: DC

## 2014-09-09 MED ORDER — OXYCODONE HCL 10 MG PO TABS: 10.0000 mg | ORAL_TABLET | Freq: Four times a day (QID) | ORAL | Status: AC | PRN

## 2014-09-09 MED ORDER — FLUCONAZOLE 100 MG PO TABS: 100.0000 mg | ORAL_TABLET | Freq: Every day | ORAL | Status: DC

## 2014-09-09 NOTE — Progress Notes (Signed)
Patient discharged per MD orders. Prescriptions given to patient. All discharge instructions given and all questions answered. Escorted by volunteers.

## 2014-09-09 NOTE — Discharge Instructions (Signed)

## 2014-09-09 NOTE — Discharge Summary (Signed)
[facility] - [place] at [facility]   PATIENT NAME: Colleen Delacruz    MR#:  [id number]  DATE OF BIRTH:  [birth year]  DATE OF ADMISSION:  09/04/2014 ADMITTING PHYSICIAN: Ramonita Lab, MD  DATE OF DISCHARGE: 09/09/2014  PRIMARY CARE PHYSICIAN: No Pcp    ADMISSION DIAGNOSIS:  Facial cellulitis [L03.211] Fever, unspecified fever cause [R50.9]  DISCHARGE DIAGNOSIS:  Active Problems:   Cellulitis, face   SECONDARY DIAGNOSIS:   Past Medical History  Diagnosis Date  . Migraines   . Depression   . Anxiety     HOSPITAL COURSE:   1. Left facial cellulitis and possible abscess. Patient was admitted to the medical floor started on IV antibiotics. The area did come to ahead and started to drain a little bit. Still had fullness underneath. Patient was seen in consultation by Dr. Irving Shows  ENT and he recommended antibiotics treatment. I spoke with Dr. Irving Shows upon the day of discharge. I unroofed this scab and no drainage came out. I will discharge home with clindamycin and pain medication. I do recommend a follow-up next week with ENT. Unfortunately Dr. Irving Shows is out of town next week one of his associates will can follow-up. 2 vaginal yeast infection- Diflucan course on antibiotics. 3 gastroesophageal reflux disease without esophagitis on omeprazole as outpatient 4 history of migraine  DISCHARGE CONDITIONS:   Satisfactory  CONSULTS OBTAINED:  Treatment Team:  Alphonzo Dublin, RN Vernie Murders, MD  DRUG ALLERGIES:  No Known Allergies  DISCHARGE MEDICATIONS:   Current Discharge Medication List    START taking these medications   Details  clindamycin (CLEOCIN) 300 MG capsule Take 1 capsule (300 mg total) by mouth 3 (three) times daily. Qty: 30 capsule, Refills: 0    fluconazole (DIFLUCAN) 100 MG tablet Take 1 tablet (100 mg total) by mouth daily. Qty: 5 tablet, Refills: 0    oxyCODONE 10 MG TABS Take 1 tablet (10 mg total) by mouth every 6 (six)  hours as needed for moderate pain. Qty: 45 tablet, Refills: 0      CONTINUE these medications which have NOT CHANGED   Details  ibuprofen (ADVIL,MOTRIN) 200 MG tablet Take 2 tablets (400 mg total) by mouth every 6 (six) hours as needed. For migraine headaches Qty: 30 tablet    omeprazole (PRILOSEC) 40 MG capsule Take 40 mg by mouth daily.         DISCHARGE INSTRUCTIONS:   Follow-up with ENT 1 week.  If you experience worsening of your admission symptoms, develop shortness of breath, life threatening emergency, suicidal or homicidal thoughts you must seek medical attention immediately by calling 911 or calling your MD immediately  if symptoms less severe.  You Must read complete instructions/literature along with all the possible adverse reactions/side effects for all the Medicines you take and that have been prescribed to you. Take any new Medicines after you have completely understood and accept all the possible adverse reactions/side effects.   Please note  You were cared for by a hospitalist during your hospital stay. If you have any questions about your discharge medications or the care you received while you were in the hospital after you are discharged, you can call the unit and asked to speak with the hospitalist on call if the hospitalist that took care of you is not available. Once you are discharged, your primary care physician will handle any further medical issues. Please note that NO REFILLS for any discharge medications will be authorized once you are discharged,  as it is imperative that you return to your primary care physician (or establish a relationship with a primary care physician if you do not have one) for your aftercare needs so that they can reassess your need for medications and monitor your lab values.    Today   CHIEF COMPLAINT:   Chief Complaint  Patient presents with  . Facial Swelling    Facial swelling to lt. side of face.    HISTORY OF PRESENT  ILLNESS:  Colleen Delacruz  is a 54 y.o. female with a known history of gastroesophageal reflux disease presented with left facial swelling and redness and pain. Found to have a left facial cellulitis. Admitted for IV antibiotics.   VITAL SIGNS:  Blood pressure 106/45, pulse 61, temperature 98 F (36.7 C), temperature source Oral, resp. rate 18, height 5\' 8"  (1.727 m), weight 90.719 kg (200 lb), SpO2 97 %.    PHYSICAL EXAMINATION:  GENERAL:  54 y.o.-year-old patient lying in the bed with no acute distress.  EYES: Pupils equal, round, reactive to light and accommodation. No scleral icterus. Extraocular muscles intact.  HEENT: Head atraumatic, normocephalic. Oropharynx and nasopharynx clear.  NECK:  Supple, no jugular venous distention. No thyroid enlargement, no tenderness.  LUNGS: Normal breath sounds bilaterally, no wheezing, rales,rhonchi or crepitation. No use of accessory muscles of respiration.  CARDIOVASCULAR: S1, S2 normal. No murmurs, rubs, or gallops.  ABDOMEN: Soft, non-tender, non-distended. Bowel sounds present. No organomegaly or mass.  EXTREMITIES: No pedal edema, cyanosis, or clubbing.  NEUROLOGIC: Cranial nerves II through XII are intact. Muscle strength 5/5 in all extremities. Sensation intact. Gait not checked.  PSYCHIATRIC: The patient is alert and oriented x 3.  SKIN: Left facial erythema fading. Fullness left cheek around 1 inch. Scab unroofed and no pus was able to be squeezed out.  DATA REVIEW:   CBC  Recent Labs Lab 09/08/14 0440  WBC 8.6  HGB 12.7  HCT 38.4  PLT 264    Chemistries   Recent Labs Lab 09/05/14 0506 09/08/14 0440  NA 141 140  K 3.7 3.3*  CL 110 103  CO2 26 27  GLUCOSE 102* 102*  BUN 9 8  CREATININE 0.84 0.90  CALCIUM 8.4* 8.8*  AST 17  --   ALT 10*  --   ALKPHOS 78  --   BILITOT 0.3  --     Management plans discussed with the patient, and she is in agreement.  CODE STATUS:     Code Status Orders        Start      Ordered   09/05/14 0237  Full code   Continuous     09/05/14 0236      TOTAL TIME TAKING CARE OF THIS PATIENT: 40 minutes.    [name] M.D on 09/09/2014 at 9:21 AM  Between 7am to 6pm - Pager - [phone]  After 6pm go to [email or web address] - password [email or web address] [facility]  [facility]  Office  [phone]  CC: Primary care physician; No Pcp

## 2014-10-27 ENCOUNTER — Encounter: Payer: Self-pay | Admitting: Emergency Medicine

## 2014-10-27 ENCOUNTER — Emergency Department
Admission: EM | Admit: 2014-10-27 | Discharge: 2014-10-27 | Disposition: A | Discharge status: Home / Self Care | Payer: Medicaid Other | Attending: Student | Admitting: Student

## 2014-10-27 DIAGNOSIS — F419 Anxiety disorder, unspecified: Secondary | ICD-10-CM | POA: Insufficient documentation

## 2014-10-27 DIAGNOSIS — K120 Recurrent oral aphthae: Secondary | ICD-10-CM | POA: Insufficient documentation

## 2014-10-27 DIAGNOSIS — Z79899 Other long term (current) drug therapy: Secondary | ICD-10-CM | POA: Insufficient documentation

## 2014-10-27 DIAGNOSIS — Z792 Long term (current) use of antibiotics: Secondary | ICD-10-CM | POA: Insufficient documentation

## 2014-10-27 DIAGNOSIS — F329 Major depressive disorder, single episode, unspecified: Secondary | ICD-10-CM | POA: Insufficient documentation

## 2014-10-27 MED ORDER — LIDOCAINE VISCOUS 2 % MT SOLN
15.0000 mL | Freq: Once | OROMUCOSAL | Status: AC
  Administered 2014-10-27: 15 mL via OROMUCOSAL
  Filled 2014-10-27: qty 15

## 2014-10-27 MED ORDER — DIPHENHYDRAMINE HCL 12.5 MG/5ML PO ELIX
12.5000 mg | ORAL_SOLUTION | Freq: Once | ORAL | Status: AC
  Administered 2014-10-27: 12.5 mg via ORAL
  Filled 2014-10-27: qty 5

## 2014-10-27 MED ORDER — TRAMADOL HCL 50 MG PO TABS
50.0000 mg | ORAL_TABLET | Freq: Once | ORAL | Status: AC
  Administered 2014-10-27: 50 mg via ORAL
  Filled 2014-10-27: qty 1

## 2014-10-27 MED ORDER — TRAMADOL HCL 50 MG PO TABS: 50.0000 mg | ORAL_TABLET | Freq: Four times a day (QID) | ORAL | Status: AC | PRN

## 2014-10-27 MED ORDER — MAGIC MOUTHWASH W/LIDOCAINE: 5.0000 mL | Freq: Four times a day (QID) | ORAL | Status: AC

## 2014-10-27 NOTE — Discharge Instructions (Signed)
Oral Ulcers Oral ulcers are painful, shallow sores around the lining of the mouth. They can affect the gums, the inside of the lips, and the cheeks. (Sores on the outside of the lips and on the face are different.) They typically first occur in school-aged children and teenagers. Oral ulcers may also be called canker sores or cold sores. CAUSES  Canker sores and cold sores can be caused by many factors including:  Infection.  Injury.  Sun exposure.  Medications.  Emotional stress.  Food allergies.  Vitamin deficiencies.  Toothpastes containing sodium lauryl sulfate. The herpes virus can be the cause of mouth ulcers. The first infection can be severe and cause 10 or more ulcers on the gums, tongue, and lips with fever and difficulty in swallowing. This infection usually occurs between the ages of 1 and 3 years.  SYMPTOMS  The typical sore is about  inch (6 mm) in size and is an oval or round ulcer with red borders. DIAGNOSIS  Your caregiver can diagnose simple oral ulcers by examination. Additional testing is usually not required.

## 2014-10-27 NOTE — ED Provider Notes (Signed)
[facility] Emergency Department Provider Note  ____________________________________________  Time seen: Approximately 10:52 AM  I have reviewed the triage vital signs and the nursing notes.   HISTORY  Chief Complaint Mouth Lesions    HPI Colleen Delacruz is a 54 y.o. female patient for pain of ulcers on her tongue for the last 2-3 months. Patient stated that these ulcers are increasing and causing her to have decreased food and fluid intake.States the ulcers on the bilateral inferior aspect of her tongue and radiating to the lateral aspect on both sides. Patient rates the pain as a 7/10. No palliative measures taken for this complaint.   Past Medical History  Diagnosis Date  . Migraines   . Depression   . Anxiety     Patient Active Problem List   Diagnosis Date Noted  . Cellulitis, face 09/05/2014  . OAB (overactive bladder) 01/13/2012  . Bipolar affective disorder, current episode mixed 01/11/2012    Class: Acute  . Post traumatic stress disorder (PTSD) 01/11/2012    Class: Chronic    Past Surgical History  Procedure Laterality Date  . Tubal ligation      Current Outpatient Rx  Name  Route  Sig  Dispense  Refill  . clindamycin (CLEOCIN) 300 MG capsule   Oral   Take 1 capsule (300 mg total) by mouth 3 (three) times daily.   30 capsule   0   . fluconazole (DIFLUCAN) 100 MG tablet   Oral   Take 1 tablet (100 mg total) by mouth daily.   5 tablet   0   . ibuprofen (ADVIL,MOTRIN) 200 MG tablet   Oral   Take 2 tablets (400 mg total) by mouth every 6 (six) hours as needed. For migraine headaches   30 tablet      . magic mouthwash w/lidocaine SOLN   Oral   Take 5 mLs by mouth 4 (four) times daily.   100 mL   0   . omeprazole (PRILOSEC) 40 MG capsule   Oral   Take 40 mg by mouth daily.         Marland Kitchen oxyCODONE 10 MG TABS   Oral   Take 1 tablet (10 mg total) by mouth every 6 (six) hours as needed for moderate pain.   45 tablet    0   . traMADol (ULTRAM) 50 MG tablet   Oral   Take 1 tablet (50 mg total) by mouth every 6 (six) hours as needed for moderate pain.   12 tablet   0     Allergies Review of patient's allergies indicates no known allergies.  History reviewed. No pertinent family history.  Social History Social History  Substance Use Topics  . Smoking status: Never Smoker   . Smokeless tobacco: Never Used  . Alcohol Use: No    Review of Systems Constitutional: No fever/chills Eyes: No visual changes.  ENT: No sore throat. Ulcers on tongue Cardiovascular: Denies chest pain. Respiratory: Denies shortness of breath. Gastrointestinal: No abdominal pain.  No nausea, no vomiting.  No diarrhea.  No constipation. Genitourinary: Negative for dysuria. Musculoskeletal: Negative for back pain. Skin: Negative for rash. Neurological: Negative for headaches, focal weakness or numbness. Psychiatric:Depression and anxiety 10-point ROS otherwise negative.  ____________________________________________   PHYSICAL EXAM:  VITAL SIGNS: ED Triage Vitals  Enc Vitals Group     BP 10/27/14 1027 157/87 mmHg     Pulse Rate 10/27/14 1027 70     Resp 10/27/14 1027 18  Temp 10/27/14 1027 97.8 F (36.6 C)     Temp Source 10/27/14 1027 Oral     SpO2 10/27/14 1027 96 %     Weight 10/27/14 1027 200 lb (90.719 kg)     Height 10/27/14 1027 5\' 9"  (1.753 m)     Head Cir --      Peak Flow --      Pain Score 10/27/14 1051 7     Pain Loc --      Pain Edu? --      Excl. in GC? --     Constitutional: Alert and oriented. Well appearing and in no acute distress. Eyes: Conjunctivae are normal. PERRL. EOMI. Head: Atraumatic. Nose: No congestion/rhinnorhea. Mouth/Throat: Mucous membranes are moist.  Oropharynx non-erythematous. Bilateral inferior tongue ulcers Neck: No stridor.   ardiovascular: Normal rate, regular rhythm. Grossly normal heart sounds.  Good peripheral circulation. Mild elevation of blood  pressure Respiratory: Normal respiratory effort.  No retractions. Lungs CTAB. Gastrointestinal: Soft and nontender. No distention. No abdominal bruits. No CVA tenderness. Musculoskeletal: No lower extremity tenderness nor edema.  No joint effusions. Neurologic:  Normal speech and language. No gross focal neurologic deficits are appreciated. No gait instability. Skin:  Skin is warm, dry and intact. No rash noted. Psychiatric: Mood and affect are normal. Speech and behavior are normal.  ____________________________________________   LABS (all labs ordered are listed, but only abnormal results are displayed)  Labs Reviewed - No data to display ____________________________________________  EKG   ____________________________________________  RADIOLOGY   ____________________________________________   PROCEDURES  Procedure(s) performed: None  Critical Care performed: No  ____________________________________________   INITIAL IMPRESSION / ASSESSMENT AND PLAN / ED COURSE  Pertinent labs & imaging results that were available during my care of the patient were reviewed by me and considered in my medical decision making (see chart for details). aphthous ulcer. ___Patient given home care instructions. Patient get a prescription for Magic mouthwash. Patient advised to follow-up with the [facility] for further evaluation and treatment. _________________________________________   FINAL CLINICAL IMPRESSION(S) / ED DIAGNOSES  Final diagnoses:  Aphthous ulcer of mouth      Joni Reining, PA-C 10/27/14 1107  Gayla Doss, MD 10/27/14 1450

## 2014-10-27 NOTE — ED Notes (Signed)
Patient to ED with c/o mouth sore under tongue over the last couple of months.

## 2016-06-13 ENCOUNTER — Encounter: Payer: Self-pay | Admitting: Emergency Medicine

## 2016-06-13 ENCOUNTER — Emergency Department
Admission: EM | Admit: 2016-06-13 | Discharge: 2016-06-13 | Disposition: A | Discharge status: Home / Self Care | Payer: Medicaid Other | Attending: Emergency Medicine | Admitting: Emergency Medicine

## 2016-06-13 DIAGNOSIS — L03012 Cellulitis of left finger: Secondary | ICD-10-CM | POA: Insufficient documentation

## 2016-06-13 MED ORDER — CLINDAMYCIN HCL 150 MG PO CAPS
300.0000 mg | ORAL_CAPSULE | Freq: Once | ORAL | Status: AC
  Administered 2016-06-13: 300 mg via ORAL
  Filled 2016-06-13: qty 2

## 2016-06-13 MED ORDER — CLINDAMYCIN HCL 300 MG PO CAPS: 300.0000 mg | ORAL_CAPSULE | Freq: Three times a day (TID) | ORAL | 0 refills | Status: AC

## 2016-06-13 NOTE — ED Triage Notes (Signed)
Patient presents to the ED with swelling and redness to her left forefinger since Sunday.  Patient states pain and swelling has been increasing.  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  Patient is in no obvious distress at this time.

## 2016-06-13 NOTE — ED Provider Notes (Signed)
[facility] Emergency Department Provider Note  ____________________________________________  Time seen: Approximately 5:20 PM  I have reviewed the triage vital signs and the nursing notes.   HISTORY  Chief Complaint Hand Pain   HPI MIABELLA SHANNAHAN is a 56 y.o. female presenting to the emergency department with 8 out of 10 left second digit pain with surrounding cellulitis. Patient denies fever or chills. Patient states that she has a history of MRSA infection. Patient denies regions of left upper extremity compromise. Patient denies associated chest pain, shortness of breath, nausea, vomiting or abdominal pain. She has tried warm compresses but no other alleviating measures. 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   Past Medical History:  Diagnosis Date  . Anxiety   . Depression   . Migraines     Patient Active Problem List   Diagnosis Date Noted  . Cellulitis, face 09/05/2014  . OAB (overactive bladder) 01/13/2012  . Bipolar affective disorder, current episode mixed (HCC) 01/11/2012    Class: Acute  . Post traumatic stress disorder (PTSD) 01/11/2012    Class: Chronic    Past Surgical History:  Procedure Laterality Date  . TUBAL LIGATION      Prior to Admission medications   Medication Sig Start Date End Date Taking? Authorizing Provider  clindamycin (CLEOCIN) 300 MG capsule Take 1 capsule (300 mg total) by mouth 3 (three) times daily. 06/13/16 06/23/16  Orvil Feil, PA-C  fluconazole (DIFLUCAN) 100 MG tablet Take 1 tablet (100 mg total) by mouth daily. 09/09/14   [name], MD  ibuprofen (ADVIL,MOTRIN) 200 MG tablet Take 2 tablets (400 mg total) by mouth every 6 (six) hours as needed. For migraine headaches 01/14/12   Sanjuana Kava, NP  magic mouthwash w/lidocaine SOLN Take 5 mLs by mouth 4 (four) times daily. 10/27/14   Joni Reining, PA-C  omeprazole (PRILOSEC) 40 MG capsule Take 40 mg by mouth daily.    Historical Provider, MD   oxyCODONE 10 MG TABS Take 1 tablet (10 mg total) by mouth every 6 (six) hours as needed for moderate pain. 09/09/14   [name], MD  traMADol (ULTRAM) 50 MG tablet Take 1 tablet (50 mg total) by mouth every 6 (six) hours as needed for moderate pain. 10/27/14   Joni Reining, PA-C    Allergies Patient has no known allergies.  No family history on file.  Social History Social History  Substance Use Topics  . Smoking status: Never Smoker  . Smokeless tobacco: Never Used  . Alcohol use No     Review of Systems  Constitutional: No fever/chills Eyes: No visual changes. No discharge ENT: No upper respiratory complaints. Cardiovascular: no chest pain. Respiratory: no cough. No SOB. Gastrointestinal: No abdominal pain.  No nausea, no vomiting.  No diarrhea.  No constipation. Musculoskeletal: Patient has left second digit pain. Skin: Patient has left second digit cellulitis. Neurological: Negative for headaches, focal weakness or numbness.  ____________________________________________   PHYSICAL EXAM:  VITAL SIGNS: ED Triage Vitals  Enc Vitals Group     BP 06/13/16 1708 (!) 133/94     Pulse Rate 06/13/16 1708 81     Resp 06/13/16 1708 18     Temp 06/13/16 1708 98.9 F (37.2 C)     Temp Source 06/13/16 1708 Oral     SpO2 06/13/16 1708 99 %     Weight 06/13/16 1709 200 lb (90.7 kg)     Height 06/13/16 1709  (1.753 m)  Head Circumference --      Peak Flow --      Pain Score 06/13/16 1708 8     Pain Loc --      Pain Edu? --      Excl. in GC? --    Constitutional: Alert and oriented. Well appearing and in no acute distress. Eyes: Conjunctivae are normal. PERRL. EOMI. Head: Atraumatic. Cardiovascular: Normal rate, regular rhythm. Normal S1 and S2.  Good peripheral circulation. Respiratory: Normal respiratory effort without tachypnea or retractions. Lungs CTAB. Good air entry to the bases with no decreased or absent breath sounds. Musculoskeletal: Full range of  motion to all extremities. No gross deformities appreciated. Neurologic:  Normal speech and language. No gross focal neurologic deficits are appreciated.  Skin: Patient has 2 cm of circumferential erythema without fluctuance or induration at the radial aspect of the left second digit. Psychiatric: Mood and affect are normal. Speech and behavior are normal. Patient exhibits appropriate insight and judgement. ____________________________________________   LABS (all labs ordered are listed, but only abnormal results are displayed)  Labs Reviewed - No data to display ____________________________________________  EKG   ____________________________________________  RADIOLOGY  No results found.  ____________________________________________    PROCEDURES  Procedure(s) performed:    Procedures    Medications  clindamycin (CLEOCIN) capsule 300 mg (300 mg Oral Given 06/13/16 1733)     ____________________________________________   INITIAL IMPRESSION / ASSESSMENT AND PLAN / ED COURSE  Pertinent labs & imaging results that were available during my care of the patient were reviewed by me and considered in my medical decision making (see chart for details).  Review of the [place] CSRS was performed in accordance of the [place] prior to dispensing any controlled drugs.     Assessment and Plan: Cellulitis:  Patient presents to the emergency department with left second digit pain with 2 cm of circumferential cellulitis. Vital signs are reassuring in the emergency department. Patient has a history of MRSA infection. She was started on clindamycin in the emergency department. A Roxicet was given for pain. Patient was discharged with clindamycin. All patient questions were answered.   ____________________________________________  FINAL CLINICAL IMPRESSION(S) / ED DIAGNOSES  Final diagnoses:  Cellulitis of finger of left hand      NEW MEDICATIONS STARTED DURING THIS VISIT:  New  Prescriptions   CLINDAMYCIN (CLEOCIN) 300 MG CAPSULE    Take 1 capsule (300 mg total) by mouth 3 (three) times daily.        This chart was dictated using voice recognition software/Dragon. Despite best efforts to proofread, errors can occur which can change the meaning. Any change was purely unintentional.    Orvil Feil, PA-C 06/13/16 1742    Merrily Brittle, MD 06/17/16 [phone]

## 2016-06-13 NOTE — ED Notes (Signed)
See triage note..the patient reports to ED w/ redness and swelling to forefinger. Noticeable red nodule to L forefinger, pt sts "I thought a hair follicle got infected".  Swelling noted to base of finger, no streaking observed. Pt denies fever, CP, n/v/d or other medical complaints. Resp even and unlabored. NAD.

## 2016-12-09 ENCOUNTER — Encounter: Payer: Self-pay | Admitting: Emergency Medicine

## 2016-12-09 ENCOUNTER — Emergency Department
Admission: EM | Admit: 2016-12-09 | Discharge: 2016-12-09 | Disposition: A | Discharge status: Home / Self Care | Payer: Self-pay | Attending: Emergency Medicine | Admitting: Emergency Medicine

## 2016-12-09 DIAGNOSIS — L0390 Cellulitis, unspecified: Secondary | ICD-10-CM

## 2016-12-09 DIAGNOSIS — F319 Bipolar disorder, unspecified: Secondary | ICD-10-CM | POA: Insufficient documentation

## 2016-12-09 DIAGNOSIS — F419 Anxiety disorder, unspecified: Secondary | ICD-10-CM | POA: Insufficient documentation

## 2016-12-09 DIAGNOSIS — Z79899 Other long term (current) drug therapy: Secondary | ICD-10-CM | POA: Insufficient documentation

## 2016-12-09 DIAGNOSIS — L03032 Cellulitis of left toe: Secondary | ICD-10-CM | POA: Insufficient documentation

## 2016-12-09 DIAGNOSIS — B078 Other viral warts: Secondary | ICD-10-CM | POA: Insufficient documentation

## 2016-12-09 MED ORDER — OXYCODONE-ACETAMINOPHEN 5-325 MG PO TABS
1.0000 | ORAL_TABLET | Freq: Once | ORAL | Status: AC
  Administered 2016-12-09: 1 via ORAL
  Filled 2016-12-09: qty 1

## 2016-12-09 MED ORDER — CLINDAMYCIN HCL 300 MG PO CAPS: 300.0000 mg | ORAL_CAPSULE | Freq: Four times a day (QID) | ORAL | 0 refills | Status: AC

## 2016-12-09 MED ORDER — LIDOCAINE-EPINEPHRINE-TETRACAINE (LET) SOLUTION
3.0000 mL | Freq: Once | NASAL | Status: AC
  Administered 2016-12-09: 3 mL via TOPICAL
  Filled 2016-12-09: qty 3

## 2016-12-09 MED ORDER — CLINDAMYCIN PHOSPHATE 600 MG/4ML IJ SOLN
600.0000 mg | Freq: Once | INTRAMUSCULAR | Status: AC
  Administered 2016-12-09: 600 mg via INTRAMUSCULAR
  Filled 2016-12-09: qty 4

## 2016-12-09 NOTE — ED Provider Notes (Signed)
[facility] Emergency Department Provider Note  __________________________[facility]__________________  Time seen: Approximately 10:34 AM  I have reviewed the triage vital signs and the nursing notes.   HISTORY  Chief Complaint Toe Pain    HPI Colleen Delacruz is a 56 y.o. female that presents to the emergency department for evaluation of a mass on toe and toe pain for 1 year. Pain worsened 2 days ago and area around mass started to turn red. Patient frequently sticks safety pin in mass to drain area. Yesterday and today area was draining clear discharge. She has been soaking toe in warm water. She has taken tylenol and ibuprofen without relief. She has had MRSA in her finger before. No fever, nausea, vomiting, abdominal pain, numbness, tingling.    Past Medical History:  Diagnosis Date  . Anxiety   . Depression   . Migraines     Patient Active Problem List   Diagnosis Date Noted  . Cellulitis, face 09/05/2014  . OAB (overactive bladder) 01/13/2012  . Bipolar affective disorder, current episode mixed (HCC) 01/11/2012    Class: Acute  . Post traumatic stress disorder (PTSD) 01/11/2012    Class: Chronic    Past Surgical History:  Procedure Laterality Date  . TUBAL LIGATION      Prior to Admission medications   Medication Sig Start Date End Date Taking? Authorizing Provider  clindamycin (CLEOCIN) 300 MG capsule Take 1 capsule (300 mg total) by mouth 4 (four) times daily. 12/09/16 12/19/16  Enid DerryWagner, [place] Severs, PA-C  fluconazole (DIFLUCAN) 100 MG tablet Take 1 tablet (100 mg total) by mouth daily. 09/09/14   Alford HighlandWieting, Richard, MD  ibuprofen (ADVIL,MOTRIN) 200 MG tablet Take 2 tablets (400 mg total) by mouth every 6 (six) hours as needed. For migraine headaches 01/14/12   Armandina StammerNwoko, Agnes I, NP  magic mouthwash w/lidocaine SOLN Take 5 mLs by mouth 4 (four) times daily. 10/27/14   Joni ReiningSmith, Ronald K, PA-C  omeprazole (PRILOSEC) 40 MG capsule Take 40 mg by mouth daily.     [provider]  oxyCODONE 10 MG TABS Take 1 tablet (10 mg total) by mouth every 6 (six) hours as needed for moderate pain. 09/09/14   Alford HighlandWieting, Richard, MD  traMADol (ULTRAM) 50 MG tablet Take 1 tablet (50 mg total) by mouth every 6 (six) hours as needed for moderate pain. 10/27/14   Joni ReiningSmith, Ronald K, PA-C    Allergies Patient has no known allergies.  No family history on file.  Social History Social History  Substance Use Topics  . Smoking status: Never Smoker  . Smokeless tobacco: Never Used  . Alcohol use No     Review of Systems  Constitutional: No fever/chills Cardiovascular: No chest pain. Respiratory: No SOB. Gastrointestinal: No abdominal pain.  No nausea, no vomiting.  Musculoskeletal: Positive for toe pain. Neurological: Negative for headaches, numbness or tingling   ____________________________________________   PHYSICAL EXAM:  VITAL SIGNS: ED Triage Vitals [12/09/16 1014]  Enc Vitals Group     BP 122/80     Pulse Rate 69     Resp 16     Temp 98.1 F (36.7 C)     Temp Source Oral     SpO2 99 %     Weight      Height      Head Circumference      Peak Flow      Pain Score      Pain Loc      Pain Edu?  Excl. in GC?      Constitutional: Alert and oriented. Well appearing and in no acute distress. Eyes: Conjunctivae are normal. PERRL. EOMI. Head: Atraumatic. ENT:      Ears:      Nose: No congestion/rhinnorhea.      Mouth/Throat: Mucous membranes are moist.  Neck: No stridor.  Cardiovascular: Normal rate, regular rhythm.  Good peripheral circulation. Respiratory: Normal respiratory effort without tachypnea or retractions. Lungs CTAB. Good air entry to the bases with no decreased or absent breath sounds. Gastrointestinal: Bowel sounds 4 quadrants. Soft and nontender to palpation. No guarding or rigidity. No palpable masses. No distention. Musculoskeletal: Full range of motion to all extremities. No gross deformities  appreciated. Neurologic:  Normal speech and language. No gross focal neurologic deficits are appreciated.  Skin:  Skin is warm, dry and intact. 1/2cm x 1/2cm tender area of swelling to base of left great toe with warty appearance to surface and visible puncture wound with minimal surrounding erythema.    ____________________________________________   LABS (all labs ordered are listed, but only abnormal results are displayed)  Labs Reviewed - No data to display ____________________________________________  EKG   ____________________________________________  RADIOLOGY   No results found.  ____________________________________________    PROCEDURES  Procedure(s) performed:    Procedures   INCISION AND DRAINAGE Performed by: Enid Derry Consent: Verbal consent obtained. Risks and benefits: risks, benefits and alternatives were discussed Type: abscess  Body area: base of great toe  Anesthesia: local infiltration  Incision was made with a scalpel.  Local anesthetic: LET  Drainage: none  Patient tolerance: Patient tolerated the procedure well with no immediate complications.    Medications  lidocaine-EPINEPHrine-tetracaine (LET) solution (3 mLs Topical Given by Other 12/09/16 1159)  clindamycin (CLEOCIN) injection 600 mg (600 mg Intramuscular Given 12/09/16 1153)  oxyCODONE-acetaminophen (PERCOCET/ROXICET) 5-325 MG per tablet 1 tablet (1 tablet Oral Given 12/09/16 1115)     ____________________________________________   INITIAL IMPRESSION / ASSESSMENT AND PLAN / ED COURSE  Pertinent labs & imaging results that were available during my care of the patient were reviewed by me and considered in my medical decision making (see chart for details).  Review of the [place] CSRS was performed in accordance of the [facility] prior to dispensing any controlled drugs.  Patient's diagnosis is consistent with infected wart. Vital signs and exam are reassuring. Drainage was  attempted and did not drain purulent drainage. Patient refused to let me make incision larger in attempt to drain area.  Patient was instructed to discontinue using safety pins to puncture area. She will continue warm soaks. Patient will be discharged home with prescriptions for clindamycin. Patient is to follow up with PCP and dermatology as directed. Patient is given ED precautions to return to the ED for any worsening or new symptoms.   ____________________________________________  FINAL CLINICAL IMPRESSION(S) / ED DIAGNOSES  Final diagnoses:  Other viral warts  Cellulitis, unspecified cellulitis site      NEW MEDICATIONS STARTED DURING THIS VISIT:  Discharge Medication List as of 12/09/2016 11:40 AM    START taking these medications   Details  clindamycin (CLEOCIN) 300 MG capsule Take 1 capsule (300 mg total) by mouth 4 (four) times daily., Starting Sun 12/09/2016, Until Wed 12/19/2016, Print            This chart was dictated using voice recognition software/Dragon. Despite best efforts to proofread, errors can occur which can change the meaning. Any change was purely unintentional.    Enid Derry, PA-C  12/09/16 1651    Merrily Brittle, MD 12/10/16 1442

## 2016-12-09 NOTE — ED Triage Notes (Signed)
Pt reports pain tom her left foot great toe for a long time now. Pt reports has a growth on it or something and the pain has become unbearable.

## 2016-12-09 NOTE — ED Notes (Signed)
Patient presents to the ED with toe pain for over 1 year that has been worse the past 2 days.  Patient states she soaked toe this morning and patient reports clear fluid draining from toe.  Toe appears slightly reddened and swollen.

## 2016-12-11 ENCOUNTER — Emergency Department: Payer: Self-pay

## 2016-12-11 ENCOUNTER — Encounter: Payer: Self-pay | Admitting: Emergency Medicine

## 2016-12-11 ENCOUNTER — Emergency Department
Admission: EM | Admit: 2016-12-11 | Discharge: 2016-12-12 | Disposition: A | Discharge status: Home / Self Care | Payer: Self-pay | Attending: Emergency Medicine | Admitting: Emergency Medicine

## 2016-12-11 DIAGNOSIS — A4902 Methicillin resistant Staphylococcus aureus infection, unspecified site: Secondary | ICD-10-CM | POA: Insufficient documentation

## 2016-12-11 DIAGNOSIS — B9562 Methicillin resistant Staphylococcus aureus infection as the cause of diseases classified elsewhere: Secondary | ICD-10-CM

## 2016-12-11 DIAGNOSIS — Z79899 Other long term (current) drug therapy: Secondary | ICD-10-CM | POA: Insufficient documentation

## 2016-12-11 LAB — CBC WITH DIFFERENTIAL/PLATELET
BASOS ABS: 0 10*3/uL (ref 0–0.1)
Basophils Relative: 0 %
EOS ABS: 0.3 10*3/uL (ref 0–0.7)
Eosinophils Relative: 4 %
HCT: 36.2 % (ref 35.0–47.0)
Hemoglobin: 12.1 g/dL (ref 12.0–16.0)
Lymphocytes Relative: 24 %
Lymphs Abs: 2.4 10*3/uL (ref 1.0–3.6)
MCH: 27.3 pg (ref 26.0–34.0)
MCHC: 33.5 g/dL (ref 32.0–36.0)
MCV: 81.5 fL (ref 80.0–100.0)
Monocytes Absolute: 0.6 10*3/uL (ref 0.2–0.9)
Monocytes Relative: 6 %
NEUTROS ABS: 6.6 10*3/uL — AB (ref 1.4–6.5)
NEUTROS PCT: 66 %
Platelets: 333 10*3/uL (ref 150–440)
RBC: 4.44 MIL/uL (ref 3.80–5.20)
RDW: 15.9 % — AB (ref 11.5–14.5)
WBC: 10 10*3/uL (ref 3.6–11.0)

## 2016-12-11 LAB — COMPREHENSIVE METABOLIC PANEL
ALBUMIN: 3.9 g/dL (ref 3.5–5.0)
ALT: 12 U/L — ABNORMAL LOW (ref 14–54)
ANION GAP: 9 (ref 5–15)
AST: 28 U/L (ref 15–41)
Alkaline Phosphatase: 83 U/L (ref 38–126)
BUN: <5 mg/dL — ABNORMAL LOW (ref 6–20)
CALCIUM: 8.6 mg/dL — AB (ref 8.9–10.3)
CO2: 22 mmol/L (ref 22–32)
CREATININE: 1.08 mg/dL — AB (ref 0.44–1.00)
Chloride: 109 mmol/L (ref 101–111)
GFR calc Af Amer: >60 mL/min (ref >60)
GFR calc non Af Amer: 57 mL/min — ABNORMAL LOW (ref >60)
Glucose, Bld: 127 mg/dL — ABNORMAL HIGH (ref 65–99)
Potassium: 3.3 mmol/L — ABNORMAL LOW (ref 3.5–5.1)
SODIUM: 140 mmol/L (ref 135–145)
Total Bilirubin: 0.5 mg/dL (ref 0.3–1.2)
Total Protein: 7.5 g/dL (ref 6.5–8.1)

## 2016-12-11 MED ORDER — FENTANYL CITRATE (PF) 100 MCG/2ML IJ SOLN
50.0000 ug | INTRAMUSCULAR | Status: AC | PRN
  Administered 2016-12-11 (×2): 50 ug via NASAL
  Filled 2016-12-11: qty 2

## 2016-12-11 MED ORDER — ONDANSETRON 4 MG PO TBDP
4.0000 mg | ORAL_TABLET | Freq: Once | ORAL | Status: AC
  Administered 2016-12-11: 4 mg via ORAL
  Filled 2016-12-11: qty 1

## 2016-12-11 MED ORDER — FENTANYL CITRATE (PF) 100 MCG/2ML IJ SOLN
INTRAMUSCULAR | Status: AC
  Administered 2016-12-11: 50 ug via NASAL
  Filled 2016-12-11: qty 2

## 2016-12-11 NOTE — ED Notes (Signed)
Pt states that she has an infected wart on left big toe. States that her foot is swollen, painful, and red all the way up to ankle.

## 2016-12-11 NOTE — ED Triage Notes (Signed)
Patient to ER for cellulitis to left great toe. Was seen here on Wednesday and given injection of antibiotic, as well as prescription of oral antibiotics. States now left leg is swollen and area of cellulitis has worsened (more swelling, more pain, more redness). Denies any known fevers.

## 2016-12-12 MED ORDER — DOCUSATE SODIUM 100 MG PO CAPS: ORAL_CAPSULE | ORAL | 0 refills | Status: AC

## 2016-12-12 MED ORDER — OXYCODONE-ACETAMINOPHEN 5-325 MG PO TABS
2.0000 | ORAL_TABLET | Freq: Once | ORAL | Status: AC
  Administered 2016-12-12: 2 via ORAL
  Filled 2016-12-12: qty 2

## 2016-12-12 MED ORDER — DOXYCYCLINE MONOHYDRATE 100 MG PO CAPS: 100.0000 mg | ORAL_CAPSULE | Freq: Two times a day (BID) | ORAL | 0 refills | Status: AC

## 2016-12-12 MED ORDER — OXYCODONE-ACETAMINOPHEN 5-325 MG PO TABS: 1.0000 | ORAL_TABLET | Freq: Four times a day (QID) | ORAL | 0 refills | Status: AC | PRN

## 2016-12-12 MED ORDER — DOXYCYCLINE HYCLATE 100 MG PO TABS
100.0000 mg | ORAL_TABLET | Freq: Once | ORAL | Status: AC
  Administered 2016-12-12: 100 mg via ORAL
  Filled 2016-12-12: qty 1

## 2016-12-12 NOTE — ED Provider Notes (Signed)
[facility] Emergency Department Provider Note  ____________________________________________   First MD Initiated Contact with Patient 12/11/16 2322     (approximate)  I have reviewed the triage vital signs and the nursing notes.   HISTORY  Chief Complaint Cellulitis    HPI Colleen Delacruz is a 56 y.o. female with PMH as listed below who presents for reevaluation of an infection of her left great toe.  She was seen 2-3 days ago in the emergency department for pain, swelling, and redness of the distal phalanx of the left great toe.  It was thought to be secondary to an infection of a wart.  The patient has been stabbing it with a safety pin to try to get out some fluid.  At the time she was cautioned not to do that anymore incision and drainage, but only a small amount of blood came out and no purulent material.  She was started on clindamycin 300 mg 4 times daily.  She presents today stating that the pain and the swelling is worse.  It is not extending beyond the toe into the foot.  She denies fever/chills, chest pain, shortness of breath, nausea, vomiting, and abdominal pain.  She states that the pain in her toe is severe, sharp, and throbbing.  Nothing makes it better and any sort of movement or weightbearing makes it worse.  It is not draining anything at this time and does not seem to be open.  She has been soaking it in warm Epsom salt baths.  She has no history of gout.  She is not diabetic.  She has a history of multiple different episodes of cellulitis/skin infections and has tested positive for MRSA in the past.  Past Medical History:  Diagnosis Date  . Anxiety   . Depression   . Migraines     Patient Active Problem List   Diagnosis Date Noted  . Cellulitis, face 09/05/2014  . OAB (overactive bladder) 01/13/2012  . Bipolar affective disorder, current episode mixed (HCC) 01/11/2012    Class: Acute  . Post traumatic stress disorder (PTSD)  01/11/2012    Class: Chronic    Past Surgical History:  Procedure Laterality Date  . TUBAL LIGATION      Prior to Admission medications   Medication Sig Start Date End Date Taking? Authorizing Provider  clindamycin (CLEOCIN) 300 MG capsule Take 1 capsule (300 mg total) by mouth 4 (four) times daily. 12/09/16 12/19/16  Enid Derry, PA-C  docusate sodium (COLACE) 100 MG capsule Take 1 tablet once or twice daily as needed for constipation while taking narcotic pain medicine 12/12/16   Loleta Rose, MD  doxycycline (MONODOX) 100 MG capsule Take 1 capsule (100 mg total) by mouth 2 (two) times daily. 12/12/16 12/22/16  Loleta Rose, MD  fluconazole (DIFLUCAN) 100 MG tablet Take 1 tablet (100 mg total) by mouth daily. 09/09/14   [name], MD  ibuprofen (ADVIL,MOTRIN) 200 MG tablet Take 2 tablets (400 mg total) by mouth every 6 (six) hours as needed. For migraine headaches 01/14/12   Armandina Stammer I, NP  magic mouthwash w/lidocaine SOLN Take 5 mLs by mouth 4 (four) times daily. 10/27/14   Joni Reining, PA-C  omeprazole (PRILOSEC) 40 MG capsule Take 40 mg by mouth daily.    [provider]  oxyCODONE 10 MG TABS Take 1 tablet (10 mg total) by mouth every 6 (six) hours as needed for moderate pain. 09/09/14   [name], MD  oxyCODONE-acetaminophen (ROXICET) [phone]  MG tablet Take 1-2 tablets by mouth every 6 (six) hours as needed for severe pain. 12/12/16   Loleta RoseForbach, Legaci Tarman, MD  traMADol (ULTRAM) 50 MG tablet Take 1 tablet (50 mg total) by mouth every 6 (six) hours as needed for moderate pain. 10/27/14   Joni ReiningSmith, Ronald K, PA-C    Allergies Patient has no known allergies.  No family history on file.  Social History Social History  Substance Use Topics  . Smoking status: Never Smoker  . Smokeless tobacco: Never Used  . Alcohol use No    Review of Systems Constitutional: No fever/chills Cardiovascular: Denies chest pain. Respiratory: Denies shortness of  breath. Gastrointestinal: No abdominal pain.  No nausea, no vomiting.  No diarrhea.  No constipation. Genitourinary: Negative for dysuria. Musculoskeletal: Pain and swelling great toe, worse over the last couple of days in spite of antibiotics Integumentary: Negative for rash. Neurological: Negative for headaches, focal weakness or numbness.   ____________________________________________   PHYSICAL EXAM:  VITAL SIGNS: ED Triage Vitals  Enc Vitals Group     BP 12/11/16 1905 (!) 156/78     Pulse Rate 12/11/16 1905 86     Resp 12/11/16 1905 18     Temp 12/11/16 1905 99.9 F (37.7 C)     Temp Source 12/11/16 1905 Oral     SpO2 12/11/16 1905 100 %     Weight 12/11/16 1907 90.7 kg (200 lb)     Height 12/11/16 1907 1.727 m (5\' 8" )     Head Circumference --      Peak Flow --      Pain Score 12/11/16 1909 10     Pain Loc --      Pain Edu? --      Excl. in GC? --     Constitutional: Alert and oriented. Well appearing and in no acute distress though she does appear uncomfortable Eyes: Conjunctivae are normal.  Head: Atraumatic. Cardiovascular: Normal rate, regular rhythm. Good peripheral circulation.  Respiratory: Normal respiratory effort.  No retractions.  Neurologic:  Normal speech and language. No gross focal neurologic deficits are appreciated.  Psychiatric: Mood and affect are normal. Speech and behavior are normal. Musculoskeletal: Erythema and swelling of the left great toe with what does appear to be a chronic wart on the distal phalanx.  There is no specific swelling or tenderness around the MTP joint which would be consistent with podagra.  Cellulitis/erythema extending proximately beyond the MTP.  There is no edema, swelling, nor tenderness of the left foot.  See photo below:      ____________________________________________   LABS (all labs ordered are listed, but only abnormal results are displayed)  Labs Reviewed  COMPREHENSIVE METABOLIC PANEL - Abnormal;  Notable for the following:       Result Value   Potassium 3.3 (*)    Glucose, Bld 127 (*)    BUN <5 (*)    Creatinine, Ser 1.08 (*)    Calcium 8.6 (*)    ALT 12 (*)    GFR calc non Af Amer 57 (*)    All other components within normal limits  CBC WITH DIFFERENTIAL/PLATELET - Abnormal; Notable for the following:    RDW 15.9 (*)    Neutro Abs 6.6 (*)    All other components within normal limits   ____________________________________________  EKG  None - EKG not ordered by ED physician ____________________________________________  RADIOLOGY Marylou MccoyI, Adham Johnson, personally viewed and evaluated these images (plain radiographs) as part of my medical decision making,  as well as reviewing the written report by the radiologist.  Dg Toe Great Left  Result Date: 12/11/2016 CLINICAL DATA:  Great toe pain and swelling. Three day status post incision and drainage. Evaluate for osteomyelitis. EXAM: LEFT GREAT TOE COMPARISON:  None. FINDINGS: Soft tissue edema about the great toe with focal soft tissue prominence about the dorsal interphalangeal joint. No soft tissue air or radiopaque foreign body. No bony destructive change, periosteal reaction, or abnormal bone density to suggest osteomyelitis. No fracture or dislocation. IMPRESSION: Soft tissue edema of the great toe without radiographic findings of osteomyelitis. No soft tissue air or radiopaque foreign body. Electronically Signed   By: Rubye Oaks M.D.   On: 12/11/2016 23:50    ____________________________________________   PROCEDURES  Critical Care performed: No   Procedure(s) performed:   Procedures   ____________________________________________   INITIAL IMPRESSION / ASSESSMENT AND PLAN / ED COURSE  As part of my medical decision making, I reviewed the following data within the electronic MEDICAL RECORD NUMBER Nursing notes reviewed and incorporated, Labs reviewed , Radiograph reviewed  and A phone consult was requested and  obtained from this/these consultant(s) podiatry (Dr. Alberteen Spindle)    The patient does have worsening swelling and pain compared to her prior visit.  Differential diagnosis includes paronychia, felon, cellulitis, osteomyelitis, fracture/dislocation, etc.  I obtained a radiograph which shows soft tissue swelling only involvement.  She has cellulitis and most likely includes MRSA given her history and the fact that she was stabbing her toe with a safety pin prior to coming to the emergency department the first time, but there is no evidence of systemic infection.  Her vital signs are stable, her lab work is reassuring with no leukocytosis or other abnormalities, she is afebrile, and she has no evidence of infection extending proximally beyond the toe.  I do not see any obvious site that would benefit from incision and drainage.  I called and spoke by phone with the podiatrist on call, Dr. Alberteen Spindle, who agreed that attempting incision and drainage in the emergency department would likely not be helpful.  He believes he can see the patient later today in clinic or possibly no later than tomorrow.  I will send him the information through [facility] for follow-up and I stressed to the patient how important it is that she follow-up with him as planned.  He also recommend that she switch to doxycycline.  She is worried about the cost and I provided a coupon which should help to further cost, but I also stressed to her that if she cannot change antibiotics she needs to continue taking the clindamycin as written.  I gave my usual and customary return precautions.  She understands and agrees with the plan.     ____________________________________________  FINAL CLINICAL IMPRESSION(S) / ED DIAGNOSES  Final diagnoses:  Infection of left great toe due to methicillin resistant Staphylococcus aureus (MRSA)     MEDICATIONS GIVEN DURING THIS VISIT:  Medications  fentaNYL (SUBLIMAZE) injection 50 mcg (50 mcg Nasal Given 12/11/16  2203)  ondansetron (ZOFRAN-ODT) disintegrating tablet 4 mg (4 mg Oral Given 12/11/16 1917)  doxycycline (VIBRA-TABS) tablet 100 mg (100 mg Oral Given 12/12/16 0052)  oxyCODONE-acetaminophen (PERCOCET/ROXICET) 5-325 MG per tablet 2 tablet (2 tablets Oral Given 12/12/16 0052)     NEW OUTPATIENT MEDICATIONS STARTED DURING THIS VISIT:  New Prescriptions   DOCUSATE SODIUM (COLACE) 100 MG CAPSULE    Take 1 tablet once or twice daily as needed for constipation while taking narcotic  pain medicine   DOXYCYCLINE (MONODOX) 100 MG CAPSULE    Take 1 capsule (100 mg total) by mouth 2 (two) times daily.   OXYCODONE-ACETAMINOPHEN (ROXICET) 5-325 MG TABLET    Take 1-2 tablets by mouth every 6 (six) hours as needed for severe pain.    Modified Medications   No medications on file    Discontinued Medications   No medications on file     Note:  This document was prepared using Dragon voice recognition software and may include unintentional dictation errors.    Loleta Rose, MD 12/12/16 [phone]

## 2016-12-12 NOTE — Discharge Instructions (Signed)
As we discussed, we believe you have infection of the soft tissue of your left great toe.  There is no sign of any involvement of the bone and your lab work is all reassuring with no sign of systemic (generalized) infection.  The person best able to address your infection and help with additional treatment is a podiatrist.  I spoke by phone with Dr. Alberteen Spindleline he should be able to see you in clinic later today, or tomorrow at the latest.  Please contact his office at the number listed above and explained to them that you were seen in the emergency department and the Dr. Alberteen Spindleline said that he would like to see you today.  They should be able to work you into the schedule.  He recommended changing her antibiotics.  We provided a prescription that should get to a good price specifically at [facility] (this was the lowest price I could find).  If you cannot afford the prescription, please continue using your existing prescription as written.  If you switch to the new medication (doxycycline), stop taking the original one.  Continue to soak your toe in warm Epsom salt baths.  Take Percocet as prescribed for severe pain. Do not drink alcohol, drive or participate in any other potentially dangerous activities while taking this medication as it may make you sleepy. Do not take this medication with any other sedating medications, either prescription or over-the-counter. If you were prescribed Percocet or Vicodin, do not take these with acetaminophen (Tylenol) as it is already contained within these medications.   This medication is an opiate (or narcotic) pain medication and can be habit forming.  Use it as little as possible to achieve adequate pain control.  Do not use or use it with extreme caution if you have a history of opiate abuse or dependence.  If you are on a pain contract with your primary care doctor or a pain specialist, be sure to let them know you were prescribed this medication today from the  [facility].  This medication is intended for your use only - do not give any to anyone else and keep it in a secure place where nobody else, especially children, have access to it.  It will also cause or worsen constipation, so you may want to consider taking an over-the-counter stool softener while you are taking this medication.    Return to the emergency department if you develop new or worsening symptoms that concern you.

## 2017-04-20 ENCOUNTER — Emergency Department
Admission: EM | Admit: 2017-04-20 | Discharge: 2017-04-20 | Disposition: A | Discharge status: Home / Self Care | Payer: Self-pay | Attending: Emergency Medicine | Admitting: Emergency Medicine

## 2017-04-20 ENCOUNTER — Encounter: Payer: Self-pay | Admitting: Emergency Medicine

## 2017-04-20 ENCOUNTER — Other Ambulatory Visit: Payer: Self-pay

## 2017-04-20 ENCOUNTER — Emergency Department: Payer: Self-pay

## 2017-04-20 DIAGNOSIS — R51 Headache: Secondary | ICD-10-CM

## 2017-04-20 DIAGNOSIS — R519 Headache, unspecified: Secondary | ICD-10-CM

## 2017-04-20 DIAGNOSIS — J111 Influenza due to unidentified influenza virus with other respiratory manifestations: Secondary | ICD-10-CM | POA: Insufficient documentation

## 2017-04-20 DIAGNOSIS — R059 Cough, unspecified: Secondary | ICD-10-CM

## 2017-04-20 DIAGNOSIS — R079 Chest pain, unspecified: Secondary | ICD-10-CM | POA: Insufficient documentation

## 2017-04-20 DIAGNOSIS — R05 Cough: Secondary | ICD-10-CM | POA: Insufficient documentation

## 2017-04-20 LAB — CBC
HCT: 41.7 % (ref 35.0–47.0)
Hemoglobin: 14.1 g/dL (ref 12.0–16.0)
MCH: 28 pg (ref 26.0–34.0)
MCHC: 33.8 g/dL (ref 32.0–36.0)
MCV: 82.8 fL (ref 80.0–100.0)
PLATELETS: 251 10*3/uL (ref 150–440)
RBC: 5.04 MIL/uL (ref 3.80–5.20)
RDW: 15.7 % — ABNORMAL HIGH (ref 11.5–14.5)
WBC: 4.5 10*3/uL (ref 3.6–11.0)

## 2017-04-20 LAB — TROPONIN I

## 2017-04-20 LAB — BASIC METABOLIC PANEL
Anion gap: 12 (ref 5–15)
BUN: 10 mg/dL (ref 6–20)
CALCIUM: 8.6 mg/dL — AB (ref 8.9–10.3)
CHLORIDE: 104 mmol/L (ref 101–111)
CO2: 20 mmol/L — AB (ref 22–32)
CREATININE: 0.98 mg/dL (ref 0.44–1.00)
GFR calc Af Amer: >60 mL/min (ref >60)
GFR calc non Af Amer: >60 mL/min (ref >60)
GLUCOSE: 156 mg/dL — AB (ref 65–99)
Potassium: 3.1 mmol/L — ABNORMAL LOW (ref 3.5–5.1)
Sodium: 136 mmol/L (ref 135–145)

## 2017-04-20 MED ORDER — BENZONATATE 100 MG PO CAPS: 100.0000 mg | ORAL_CAPSULE | Freq: Four times a day (QID) | ORAL | 0 refills | Status: AC | PRN

## 2017-04-20 MED ORDER — IBUPROFEN 800 MG PO TABS
800.0000 mg | ORAL_TABLET | Freq: Once | ORAL | Status: AC
  Administered 2017-04-20: 800 mg via ORAL

## 2017-04-20 MED ORDER — IBUPROFEN 800 MG PO TABS
ORAL_TABLET | ORAL | Status: AC
  Filled 2017-04-20: qty 1

## 2017-04-20 MED ORDER — SODIUM CHLORIDE 0.9 % IV BOLUS (SEPSIS)
1000.0000 mL | Freq: Once | INTRAVENOUS | Status: AC
  Administered 2017-04-20: 1000 mL via INTRAVENOUS

## 2017-04-20 MED ORDER — PROCHLORPERAZINE EDISYLATE 5 MG/ML IJ SOLN
10.0000 mg | Freq: Once | INTRAMUSCULAR | Status: AC
  Administered 2017-04-20: 10 mg via INTRAVENOUS
  Filled 2017-04-20: qty 2

## 2017-04-20 MED ORDER — ONDANSETRON HCL 4 MG PO TABS: 4.0000 mg | ORAL_TABLET | Freq: Three times a day (TID) | ORAL | 0 refills | Status: AC | PRN

## 2017-04-20 NOTE — Discharge Instructions (Signed)
Please seek medical attention for any high fevers, chest pain, shortness of breath, change in behavior, persistent vomiting, bloody stool or any other new or concerning symptoms.  

## 2017-04-20 NOTE — ED Provider Notes (Signed)
[facility] Emergency Department Provider Note   ____________________________________________   I have reviewed the triage vital signs and the nursing notes.   HISTORY  Chief Complaint Chest Pain; Cough; and Emesis   History limited by: Not Limited   HPI Colleen Delacruz is a 57 y.o. female who presents to the emergency department today because of concerns for influenza and influenza symptoms.  She has been sick for the past 3 days.  Her husband was tested for influenza today and was positive.  The patient's main symptoms are headache, body aches, nausea.  She states she has been trying Tylenol without any relief.   Per medical record review patient has a history of depression, migraines.   Past Medical History:  Diagnosis Date  . Anxiety   . Depression   . Migraines     Patient Active Problem List   Diagnosis Date Noted  . Cellulitis, face 09/05/2014  . OAB (overactive bladder) 01/13/2012  . Bipolar affective disorder, current episode mixed (HCC) 01/11/2012    Class: Acute  . Post traumatic stress disorder (PTSD) 01/11/2012    Class: Chronic    Past Surgical History:  Procedure Laterality Date  . TUBAL LIGATION      Prior to Admission medications   Medication Sig Start Date End Date Taking? Authorizing Provider  ibuprofen (ADVIL,MOTRIN) 200 MG tablet Take 2 tablets (400 mg total) by mouth every 6 (six) hours as needed. For migraine headaches 01/14/12  Yes Armandina Stammer I, NP  docusate sodium (COLACE) 100 MG capsule Take 1 tablet once or twice daily as needed for constipation while taking narcotic pain medicine Patient not taking: Reported on 04/20/2017 12/12/16   Loleta Rose, MD  magic mouthwash w/lidocaine SOLN Take 5 mLs by mouth 4 (four) times daily. Patient not taking: Reported on 04/20/2017 10/27/14   Joni Reining, PA-C  oxyCODONE 10 MG TABS Take 1 tablet (10 mg total) by mouth every 6 (six) hours as needed for moderate pain. Patient not  taking: Reported on 04/20/2017 09/09/14   [name], MD  oxyCODONE-acetaminophen (ROXICET) 5-325 MG tablet Take 1-2 tablets by mouth every 6 (six) hours as needed for severe pain. Patient not taking: Reported on 04/20/2017 12/12/16   Loleta Rose, MD  traMADol (ULTRAM) 50 MG tablet Take 1 tablet (50 mg total) by mouth every 6 (six) hours as needed for moderate pain. Patient not taking: Reported on 04/20/2017 10/27/14   Joni Reining, PA-C    Allergies Patient has no known allergies.  No family history on file.  Social History Social History   Tobacco Use  . Smoking status: Never Smoker  . Smokeless tobacco: Never Used  Substance Use Topics  . Alcohol use: No  . Drug use: No    Review of Systems Constitutional: Positive for fever Eyes: No visual changes. ENT: No sore throat. Cardiovascular: Denies chest pain. Respiratory: Denies shortness of breath. Gastrointestinal: No abdominal pain.  No nausea, no vomiting.  No diarrhea.   Genitourinary: Negative for dysuria. Musculoskeletal: Positive for muscle aches. Skin: Negative for rash. Neurological: Positive for headache.  ____________________________________________   PHYSICAL EXAM:  VITAL SIGNS: ED Triage Vitals  Enc Vitals Group     BP 04/20/17 1420 100/68     Pulse Rate 04/20/17 1420 (!) 110     Resp 04/20/17 1420 (!) 24     Temp 04/20/17 1420 (!) 102.8 F (39.3 C)     Temp Source 04/20/17 1420 Oral     SpO2  04/20/17 1420 98 %     Weight 04/20/17 1421 200 lb (90.7 kg)     Height 04/20/17 1421 5\' 9"  (1.753 m)     Head Circumference --      Peak Flow --      Pain Score 04/20/17 1421 10   Constitutional: Alert and oriented. Well appearing and in no distress. Eyes: Conjunctivae are normal.  ENT   Head: Normocephalic and atraumatic.   Nose: No congestion/rhinnorhea.   Mouth/Throat: Mucous membranes are moist.   Neck: No stridor. Hematological/Lymphatic/Immunilogical: No cervical  lymphadenopathy. Cardiovascular: Normal rate, regular rhythm.  No murmurs, rubs, or gallops. Respiratory: Normal respiratory effort without tachypnea nor retractions. Breath sounds are clear and equal bilaterally. No wheezes/rales/rhonchi. Gastrointestinal: Soft and non tender. No rebound. No guarding.  Genitourinary: Deferred Musculoskeletal: Normal range of motion in all extremities. No lower extremity edema. Neurologic:  Normal speech and language. No gross focal neurologic deficits are appreciated.  Skin:  Skin is warm, dry and intact. No rash noted. Psychiatric: Mood and affect are normal. Speech and behavior are normal. Patient exhibits appropriate insight and judgment.  ____________________________________________    LABS (pertinent positives/negatives)  Trop <0.03 CBC wnl except rdw 15.7 BMP na 136, k 3.1, glu 156, cr 0.98  ____________________________________________   EKG  I, Phineas SemenGraydon Modelle Vollmer, attending physician, personally viewed and interpreted this EKG  EKG Time: 1423 Rate: 112 Rhythm: sinus tachycardia with pac Axis: normal Intervals: qtc 442 QRS: narrow ST changes: no st elevation Impression: abnormal ekg   ____________________________________________    RADIOLOGY  CXR No acute disease ____________________________________________   PROCEDURES  Procedures  ____________________________________________   INITIAL IMPRESSION / ASSESSMENT AND PLAN / ED COURSE  Pertinent labs & imaging results that were available during my care of the patient were reviewed by me and considered in my medical decision making (see chart for details).  Patient presented to the emergency department today because of concerns for influenza type symptoms.  Husband was diagnosed with influenza.  Patient did feel better after fluids and medication.  Discussed with patient that she would be outside the window for Tamiflu.  Will discharge home with symptomatic  treatment.   ____________________________________________   FINAL CLINICAL IMPRESSION(S) / ED DIAGNOSES  Final diagnoses:  Influenza  Cough  Bad headache     Note: This dictation was prepared with Dragon dictation. Any transcriptional errors that result from this process are unintentional     Phineas SemenGoodman, Shahab Polhamus, MD 04/20/17 2025

## 2017-04-20 NOTE — ED Triage Notes (Signed)
Pt to ed with c/o chest pain, cough, vomiting that started Thursday pm.  Pt reports "my ears hurt so bad, I want to cry"

## 2017-12-14 IMAGING — DX DG TOE GREAT 2+V*L*
3 series · 3 of 3 positions shown · non-contrast
Comparison: None.

CLINICAL DATA: Great toe pain and swelling. Three day status post
incision and drainage. Evaluate for osteomyelitis.

EXAM:
LEFT GREAT TOE

[toe ap]
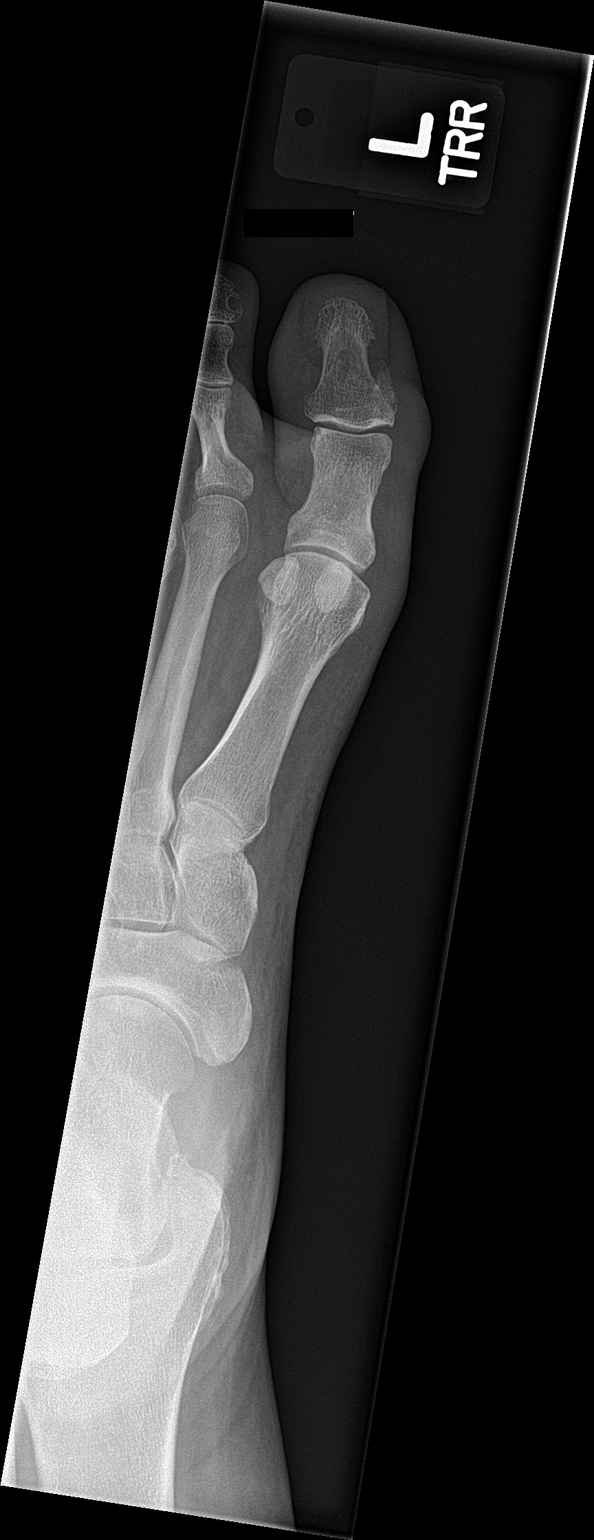

[toe obl]
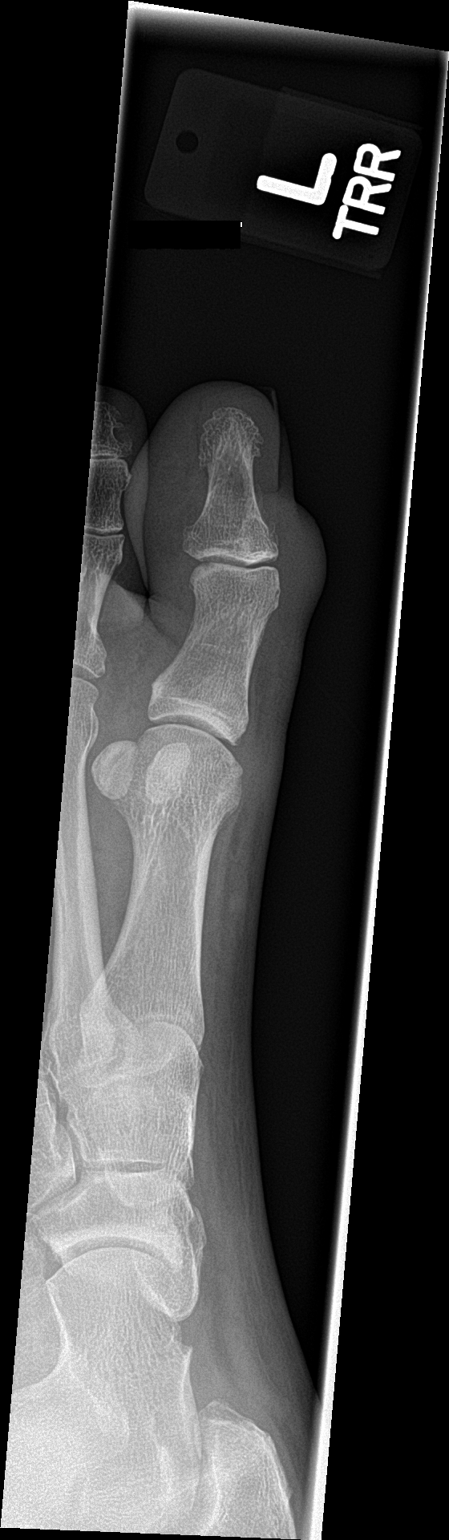

[toe lat]
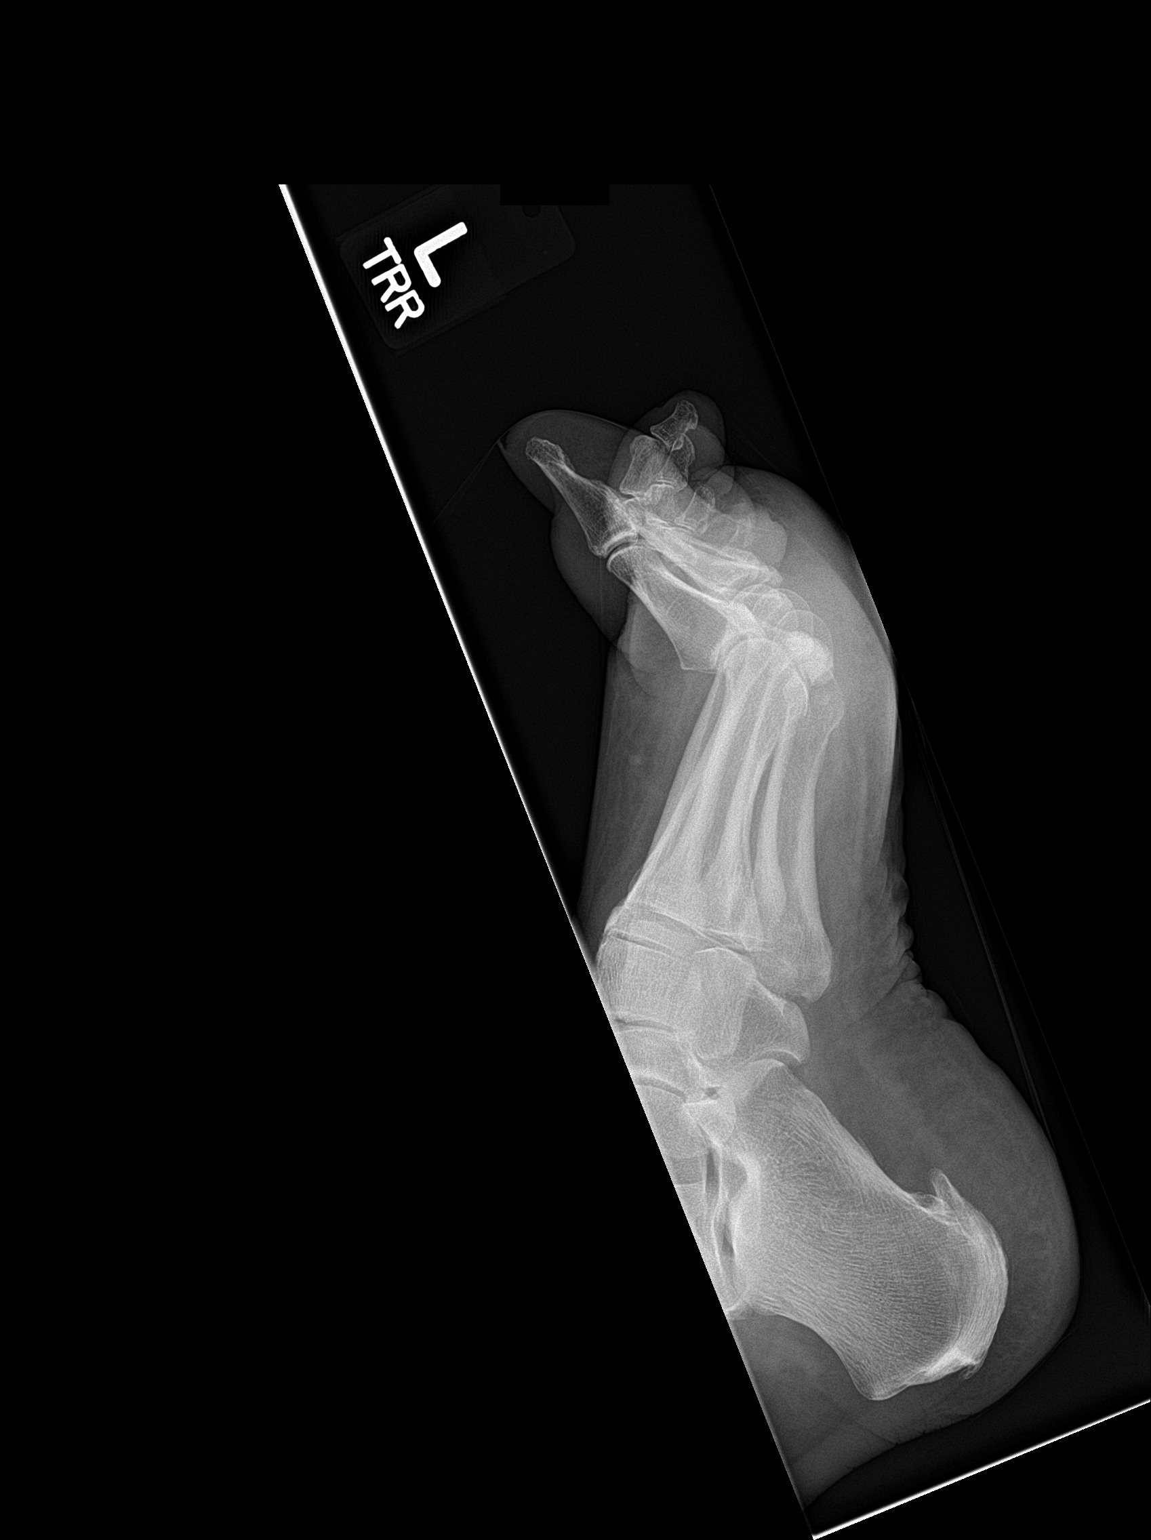

[3 of 3 positions shown; findings below may reference images not displayed]

FINDINGS: Soft tissue edema about the great toe with focal soft tissue
prominence about the dorsal interphalangeal joint. No soft tissue
air or radiopaque foreign body. No bony destructive change,
periosteal reaction, or abnormal bone density to suggest
osteomyelitis. No fracture or dislocation.
IMPRESSION: Soft tissue edema of the great toe without radiographic findings of
osteomyelitis. No soft tissue air or radiopaque foreign body.

## 2018-04-23 IMAGING — CR DG CHEST 2V
2 series · 2 of 2 positions shown · non-contrast
Comparison: 10/24/2012

CLINICAL DATA: Chest pain.  Cough.  Vomiting.

EXAM:
CHEST - 2 VIEW

[chest pa]
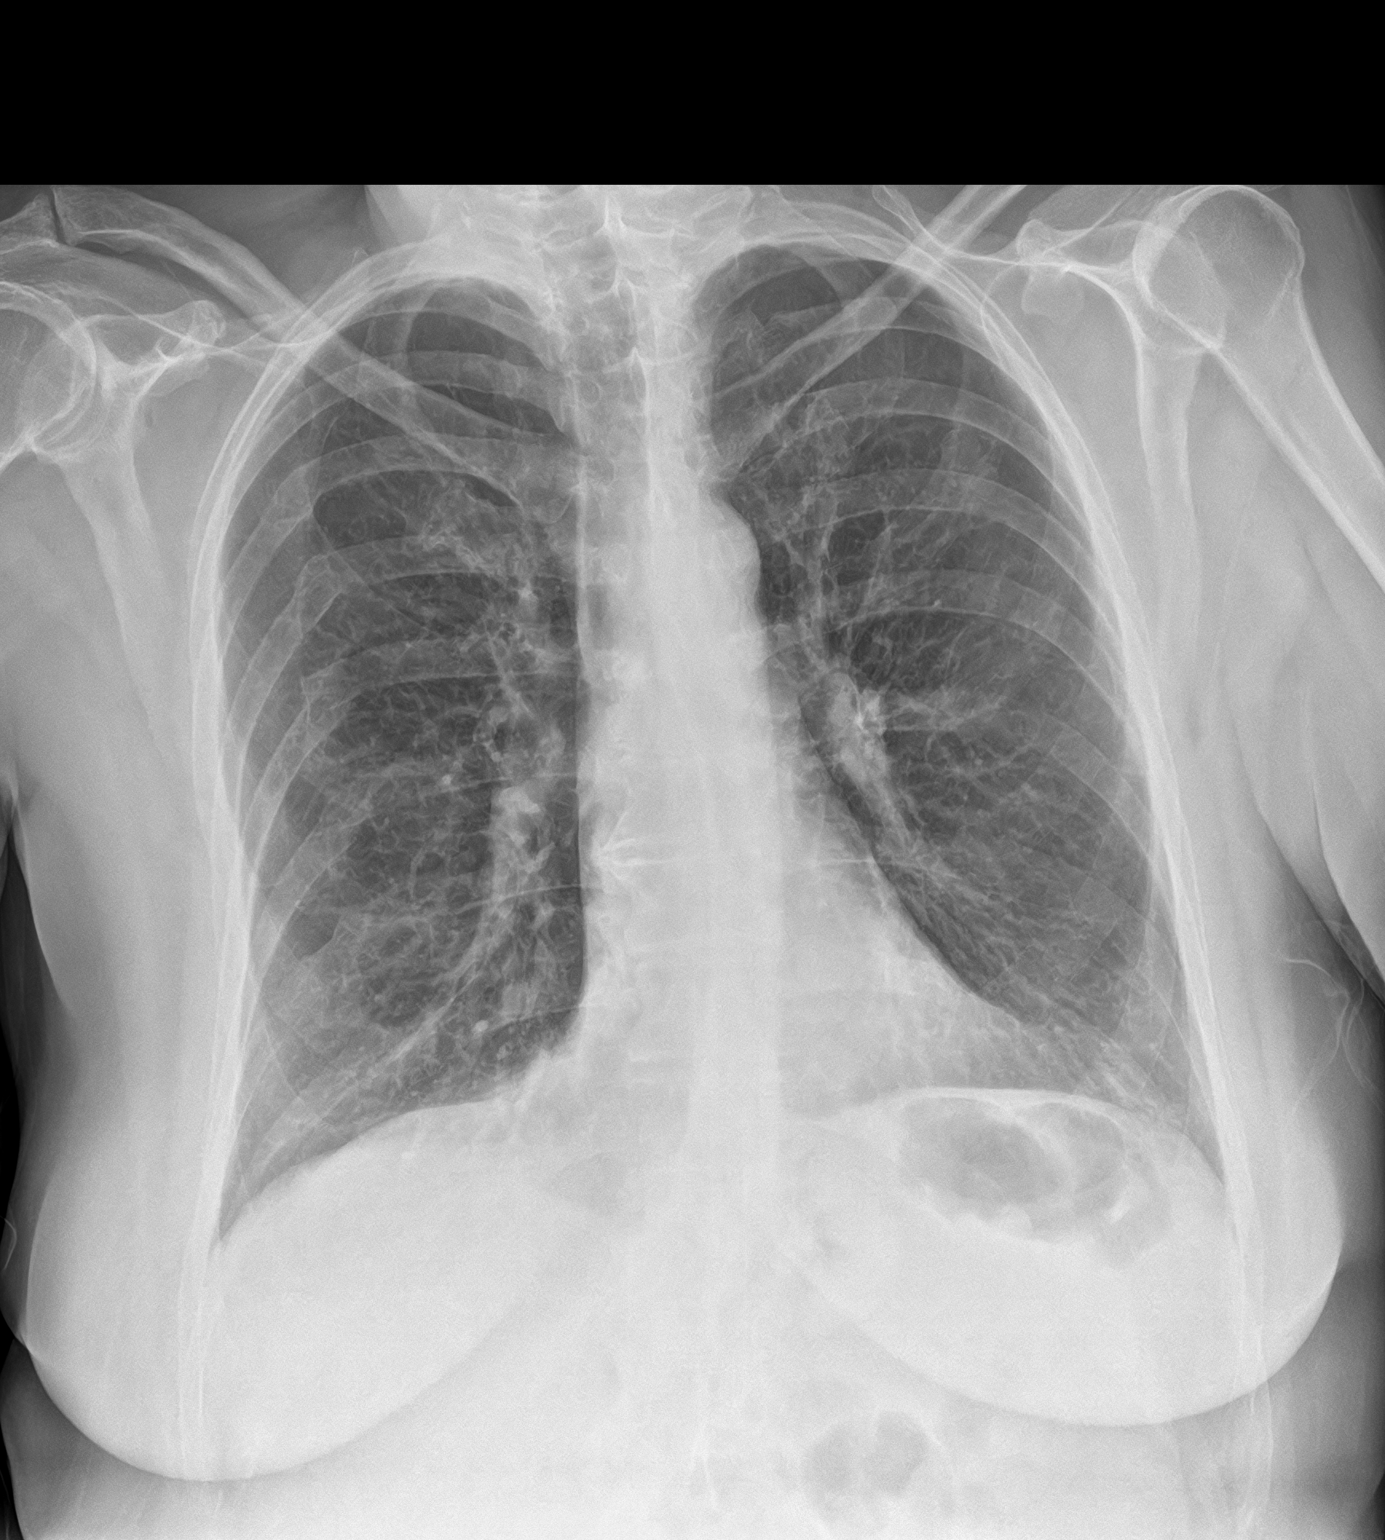

[chest lat]
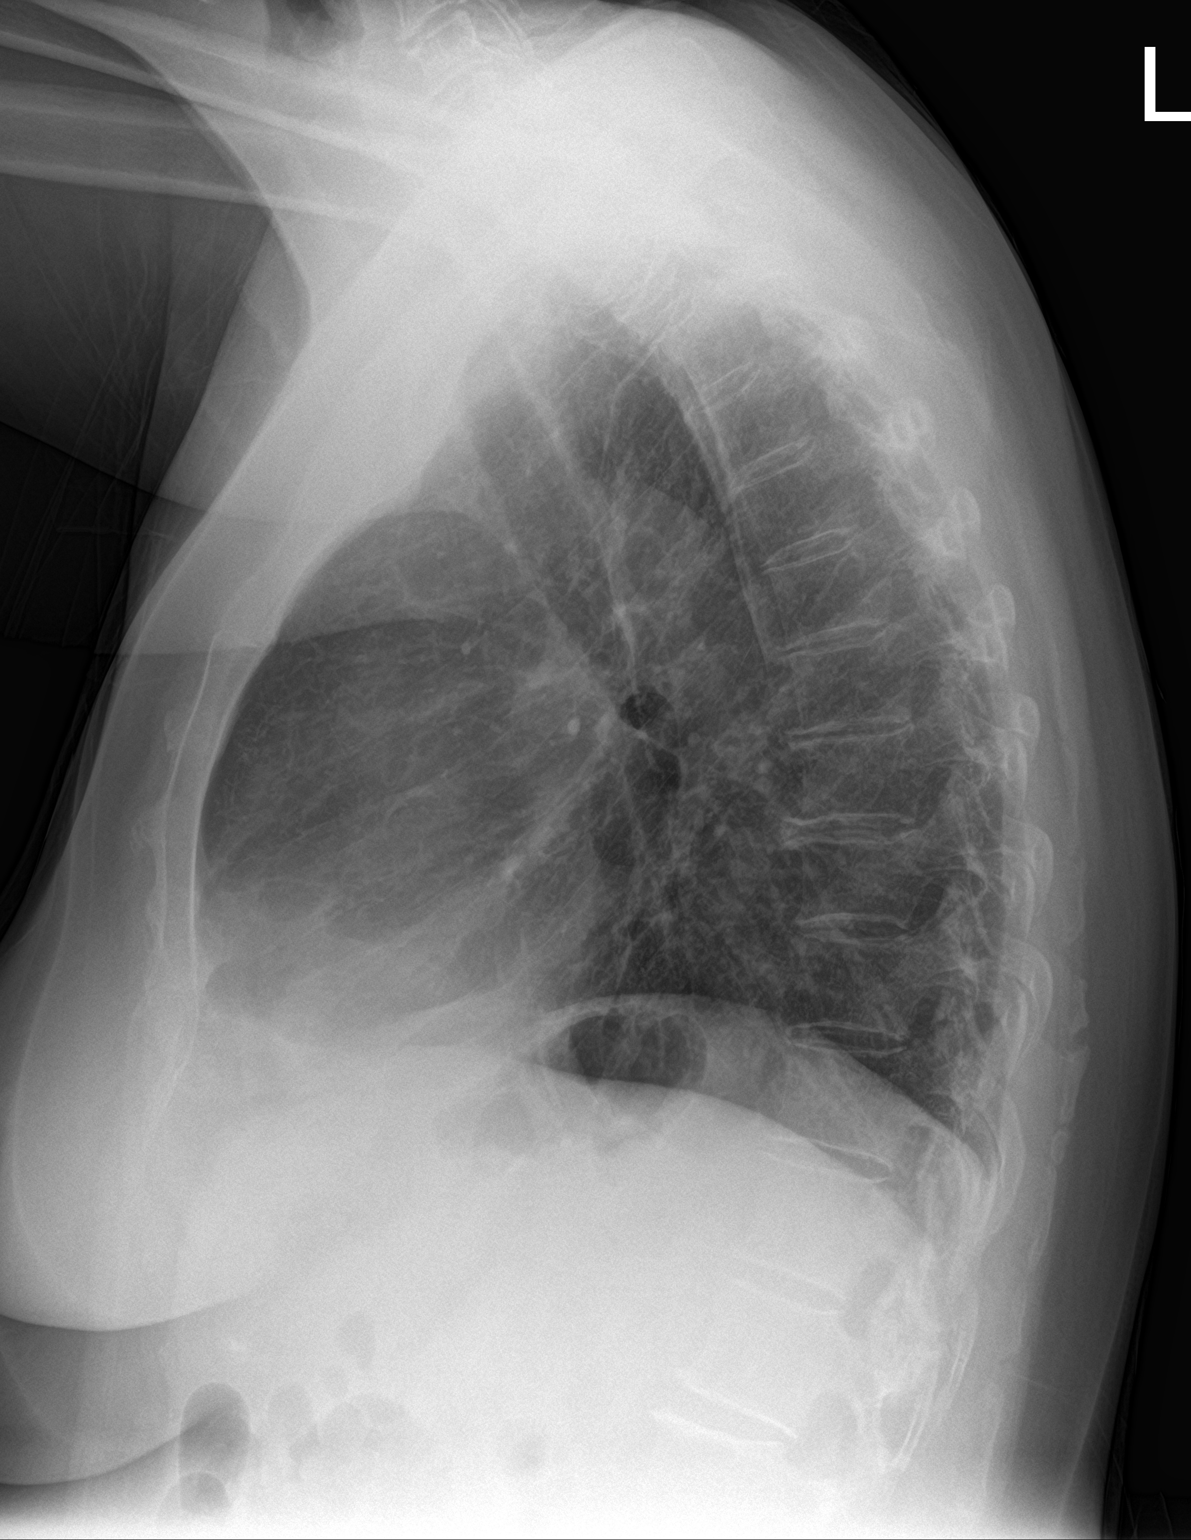

[2 of 2 positions shown; findings below may reference images not displayed]

FINDINGS: Hyperinflation. Extensive remote right rib fractures. Midline
trachea. Normal heart size and mediastinal contours. No pleural
effusion or pneumothorax. Suspect scarring at the left lung base
laterally. Clear right lung.
IMPRESSION: No acute cardiopulmonary disease.

## 2020-10-10 ENCOUNTER — Encounter: Payer: Self-pay | Admitting: Emergency Medicine

## 2020-10-10 ENCOUNTER — Ambulatory Visit
Admission: EM | Admit: 2020-10-10 | Discharge: 2020-10-10 | Disposition: A | Discharge status: Home / Self Care | Payer: Self-pay | Attending: Physician Assistant | Admitting: Physician Assistant

## 2020-10-10 DIAGNOSIS — J069 Acute upper respiratory infection, unspecified: Secondary | ICD-10-CM

## 2020-10-10 DIAGNOSIS — Z1152 Encounter for screening for COVID-19: Secondary | ICD-10-CM

## 2020-10-10 LAB — POCT INFLUENZA A/B
Influenza A, POC: NEGATIVE
Influenza B, POC: NEGATIVE

## 2020-10-10 MED ORDER — BENZONATATE 100 MG PO CAPS: 100.0000 mg | ORAL_CAPSULE | Freq: Three times a day (TID) | ORAL | 0 refills | Status: AC | PRN

## 2020-10-10 NOTE — ED Triage Notes (Signed)
Patient c/o dry cough, body aches, headaches for a couple of days.  Patient is not vaccinated for COVID.

## 2020-10-10 NOTE — Discharge Instructions (Addendum)
Drink lots of fluids and rest.  Can take ibuprofen or tylenol as needed for body aches and headaches Recommend Flonase and Mucinex to help with drainage.   I sent in Tessalon pearls to take as needed for cough.  COVID test pending.  If positive isolate 5 days from symptom onset and then wear a mask around others for 5 days.

## 2020-10-10 NOTE — ED Provider Notes (Signed)
[facility]    CSN: [id number] Arrival date & time: 10/10/20  1454      History   Chief Complaint Chief Complaint  Patient presents with   URI    HPI Colleen Delacruz is a 60 y.o. female.   Pt complains of headache, body aches, and dry cough that started several days ago.  She also complains of sore throat. She denies congestion, fever, chills.  Has taken excedrin migraine with some relief and using halls cough drops. She has not had her COVID vaccine.     Past Medical History:  Diagnosis Date   Anxiety    Depression    Migraines     Patient Active Problem List   Diagnosis Date Noted   Cellulitis, face 09/05/2014   OAB (overactive bladder) 01/13/2012   Bipolar affective disorder, current episode mixed (HCC) 01/11/2012    Class: Acute   Post traumatic stress disorder (PTSD) 01/11/2012    Class: Chronic    Past Surgical History:  Procedure Laterality Date   TUBAL LIGATION      OB History     Gravida  1   Para  1   Term  1   Preterm      AB      Living  1      SAB      IAB      Ectopic      Multiple      Live Births               Home Medications    Prior to Admission medications   Medication Sig Start Date End Date Taking? Authorizing Provider  benzonatate (TESSALON) 100 MG capsule Take 1 capsule (100 mg total) by mouth 3 (three) times daily as needed for cough. 10/10/20  Yes Ward, Tylene Fantasia, PA-C  docusate sodium (COLACE) 100 MG capsule Take 1 tablet once or twice daily as needed for constipation while taking narcotic pain medicine Patient not taking: No sig reported 12/12/16   Loleta Rose, MD  ibuprofen (ADVIL,MOTRIN) 200 MG tablet Take 2 tablets (400 mg total) by mouth every 6 (six) hours as needed. For migraine headaches 01/14/12   Armandina Stammer I, NP  magic mouthwash w/lidocaine SOLN Take 5 mLs by mouth 4 (four) times daily. Patient not taking: No sig reported 10/27/14   Joni Reining, PA-C  ondansetron (ZOFRAN) 4 MG  tablet Take 1 tablet (4 mg total) by mouth every 8 (eight) hours as needed for nausea or vomiting. 04/20/17   Phineas Semen, MD  oxyCODONE 10 MG TABS Take 1 tablet (10 mg total) by mouth every 6 (six) hours as needed for moderate pain. Patient not taking: No sig reported 09/09/14   [name], MD  oxyCODONE-acetaminophen (ROXICET) 5-325 MG tablet Take 1-2 tablets by mouth every 6 (six) hours as needed for severe pain. Patient not taking: No sig reported 12/12/16   Loleta Rose, MD  traMADol (ULTRAM) 50 MG tablet Take 1 tablet (50 mg total) by mouth every 6 (six) hours as needed for moderate pain. Patient not taking: No sig reported 10/27/14   Joni Reining, PA-C    Family History No family history on file.  Social History Social History   Tobacco Use   Smoking status: Never   Smokeless tobacco: Never  Substance Use Topics   Alcohol use: No   Drug use: No     Allergies   Patient has no known allergies.   Review of  Systems Review of Systems  Constitutional:  Negative for chills and fever.  HENT:  Positive for sore throat. Negative for ear pain.   Eyes:  Negative for pain and visual disturbance.  Respiratory:  Positive for cough. Negative for shortness of breath.   Cardiovascular:  Negative for chest pain and palpitations.  Gastrointestinal:  Negative for abdominal pain and vomiting.  Genitourinary:  Negative for dysuria and hematuria.  Musculoskeletal:  Positive for myalgias. Negative for arthralgias and back pain.  Skin:  Negative for color change and rash.  Neurological:  Negative for seizures and syncope.  All other systems reviewed and are negative.   Physical Exam Triage Vital Signs ED Triage Vitals  Enc Vitals Group     BP 10/10/20 1545 106/67     Pulse Rate 10/10/20 1545 95     Resp 10/10/20 1545 18     Temp 10/10/20 1545 98.9 F (37.2 C)     Temp Source 10/10/20 1545 Oral     SpO2 10/10/20 1545 95 %     Weight --      Height 10/10/20 1546 5\' 8"   (1.727 m)     Head Circumference --      Peak Flow --      Pain Score 10/10/20 1545 0     Pain Loc --      Pain Edu? --      Excl. in GC? --    No data found.  Updated Vital Signs BP 106/67 (BP Location: Left Arm)   Pulse 95   Temp 98.9 F (37.2 C) (Oral)   Resp 18   Ht 5\' 8"  (1.727 m)   SpO2 95%   BMI 30.41 kg/m   Visual Acuity Right Eye Distance:   Left Eye Distance:   Bilateral Distance:    Right Eye Near:   Left Eye Near:    Bilateral Near:     Physical Exam Vitals and nursing note reviewed.  Constitutional:      General: She is not in acute distress.    Appearance: She is well-developed.  HENT:     Head: Normocephalic and atraumatic.  Eyes:     Conjunctiva/sclera: Conjunctivae normal.  Cardiovascular:     Rate and Rhythm: Normal rate and regular rhythm.     Heart sounds: No murmur heard. Pulmonary:     Effort: Pulmonary effort is normal. No respiratory distress.     Breath sounds: Normal breath sounds.  Abdominal:     Palpations: Abdomen is soft.     Tenderness: There is no abdominal tenderness.  Musculoskeletal:     Cervical back: Neck supple.  Skin:    General: Skin is warm and dry.  Neurological:     Mental Status: She is alert.     UC Treatments / Results  Labs (all labs ordered are listed, but only abnormal results are displayed) Labs Reviewed  NOVEL CORONAVIRUS, NAA - Abnormal; Notable for the following components:      Result Value   SARS-CoV-2, NAA Detected (*)    All other components within normal limits   Narrative:    Performed at:  [address], [place], [address]  [place] [facility] Director: [place] MD, Phone:  [phone]  SARS-COV-2, NAA 2 DAY TAT   Narrative:    Performed at:  [address] [place] [address] [place], [address]  [place] [facility] Director: [place] MD, Phone:  [phone]  POCT INFLUENZA A/B    EKG  Radiology No results found.  Procedures Procedures (including  critical care time)  Medications Ordered in UC Medications - No data to display  Initial Impression / Assessment and Plan / UC Course  I have reviewed the triage vital signs and the nursing notes.  Pertinent labs & imaging results that were available during my care of the patient were reviewed by me and considered in my medical decision making (see chart for details).     URI, COVID test pending. Tessalon pearls prescribed. Discussed supportive care.  Return precautions discussed.  Vitals wnl, pt stable for discharge.  Final Clinical Impressions(s) / UC Diagnoses   Final diagnoses:  Acute upper respiratory infection  Encounter for screening for COVID-19     Discharge Instructions      Drink lots of fluids and rest.  Can take ibuprofen or tylenol as needed for body aches and headaches Recommend Flonase and Mucinex to help with drainage.   I sent in Tessalon pearls to take as needed for cough.  COVID test pending.  If positive isolate 5 days from symptom onset and then wear a mask around others for 5 days.      ED Prescriptions     Medication Sig Dispense Auth. Provider   benzonatate (TESSALON) 100 MG capsule Take 1 capsule (100 mg total) by mouth 3 (three) times daily as needed for cough. 21 capsule Ward, Tylene Fantasia, PA-C      PDMP not reviewed this encounter.   Ward, Tylene Fantasia, PA-C 11/09/20 1130

## 2020-10-11 LAB — SARS-COV-2, NAA 2 DAY TAT

## 2020-10-11 LAB — NOVEL CORONAVIRUS, NAA: SARS-CoV-2, NAA: DETECTED — AB
